# Patient Record
Sex: Male | Born: 1955 | Race: White | Hispanic: No | Marital: Married | Smoking: Never smoker
Health system: Southern US, Community
[De-identification: ages and names within clinical notes are randomized; demographics above are authoritative.]

## PROBLEM LIST (undated history)

## (undated) DIAGNOSIS — Z9841 Cataract extraction status, right eye: Secondary | ICD-10-CM

## (undated) DIAGNOSIS — E785 Hyperlipidemia, unspecified: Secondary | ICD-10-CM

## (undated) DIAGNOSIS — N200 Calculus of kidney: Secondary | ICD-10-CM

## (undated) DIAGNOSIS — Z7982 Long term (current) use of aspirin: Secondary | ICD-10-CM

## (undated) DIAGNOSIS — I10 Essential (primary) hypertension: Secondary | ICD-10-CM

## (undated) DIAGNOSIS — T8859XA Other complications of anesthesia, initial encounter: Secondary | ICD-10-CM

## (undated) DIAGNOSIS — N401 Enlarged prostate with lower urinary tract symptoms: Secondary | ICD-10-CM

## (undated) DIAGNOSIS — I700 Atherosclerosis of aorta: Secondary | ICD-10-CM

## (undated) DIAGNOSIS — N210 Calculus in bladder: Secondary | ICD-10-CM

## (undated) DIAGNOSIS — E119 Type 2 diabetes mellitus without complications: Secondary | ICD-10-CM

## (undated) DIAGNOSIS — I2510 Atherosclerotic heart disease of native coronary artery without angina pectoris: Secondary | ICD-10-CM

## (undated) DIAGNOSIS — M1990 Unspecified osteoarthritis, unspecified site: Secondary | ICD-10-CM

## (undated) DIAGNOSIS — K5790 Diverticulosis of intestine, part unspecified, without perforation or abscess without bleeding: Secondary | ICD-10-CM

## (undated) DIAGNOSIS — I4510 Unspecified right bundle-branch block: Secondary | ICD-10-CM

## (undated) DIAGNOSIS — I5189 Other ill-defined heart diseases: Secondary | ICD-10-CM

## (undated) HISTORY — PX: KIDNEY STONE SURGERY: SHX686

---

## 2002-08-17 DIAGNOSIS — N200 Calculus of kidney: Secondary | ICD-10-CM

## 2002-08-17 DIAGNOSIS — N12 Tubulo-interstitial nephritis, not specified as acute or chronic: Secondary | ICD-10-CM

## 2002-08-17 HISTORY — DX: Calculus of kidney: N20.0

## 2002-08-17 HISTORY — DX: Tubulo-interstitial nephritis, not specified as acute or chronic: N12

## 2003-06-26 ENCOUNTER — Other Ambulatory Visit: Payer: Self-pay

## 2006-01-22 ENCOUNTER — Ambulatory Visit: Payer: Self-pay | Admitting: Internal Medicine

## 2008-05-18 ENCOUNTER — Ambulatory Visit: Payer: Self-pay | Admitting: Gastroenterology

## 2010-05-26 ENCOUNTER — Emergency Department: Payer: Self-pay | Admitting: Emergency Medicine

## 2010-05-27 ENCOUNTER — Ambulatory Visit: Payer: Self-pay | Admitting: Specialist

## 2011-01-28 ENCOUNTER — Emergency Department: Payer: Self-pay | Admitting: Emergency Medicine
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
## 2012-06-25 IMAGING — CR LEFT MIDDLE FINGER 2+V
1 series · 3 of 3 positions shown · non-contrast
Comparison: none

REASON FOR EXAM: post reduction
COMMENTS:

[Series 1: view not recorded · 0.17mm/px · 3 of 3 slices shown]
[im 1/3]
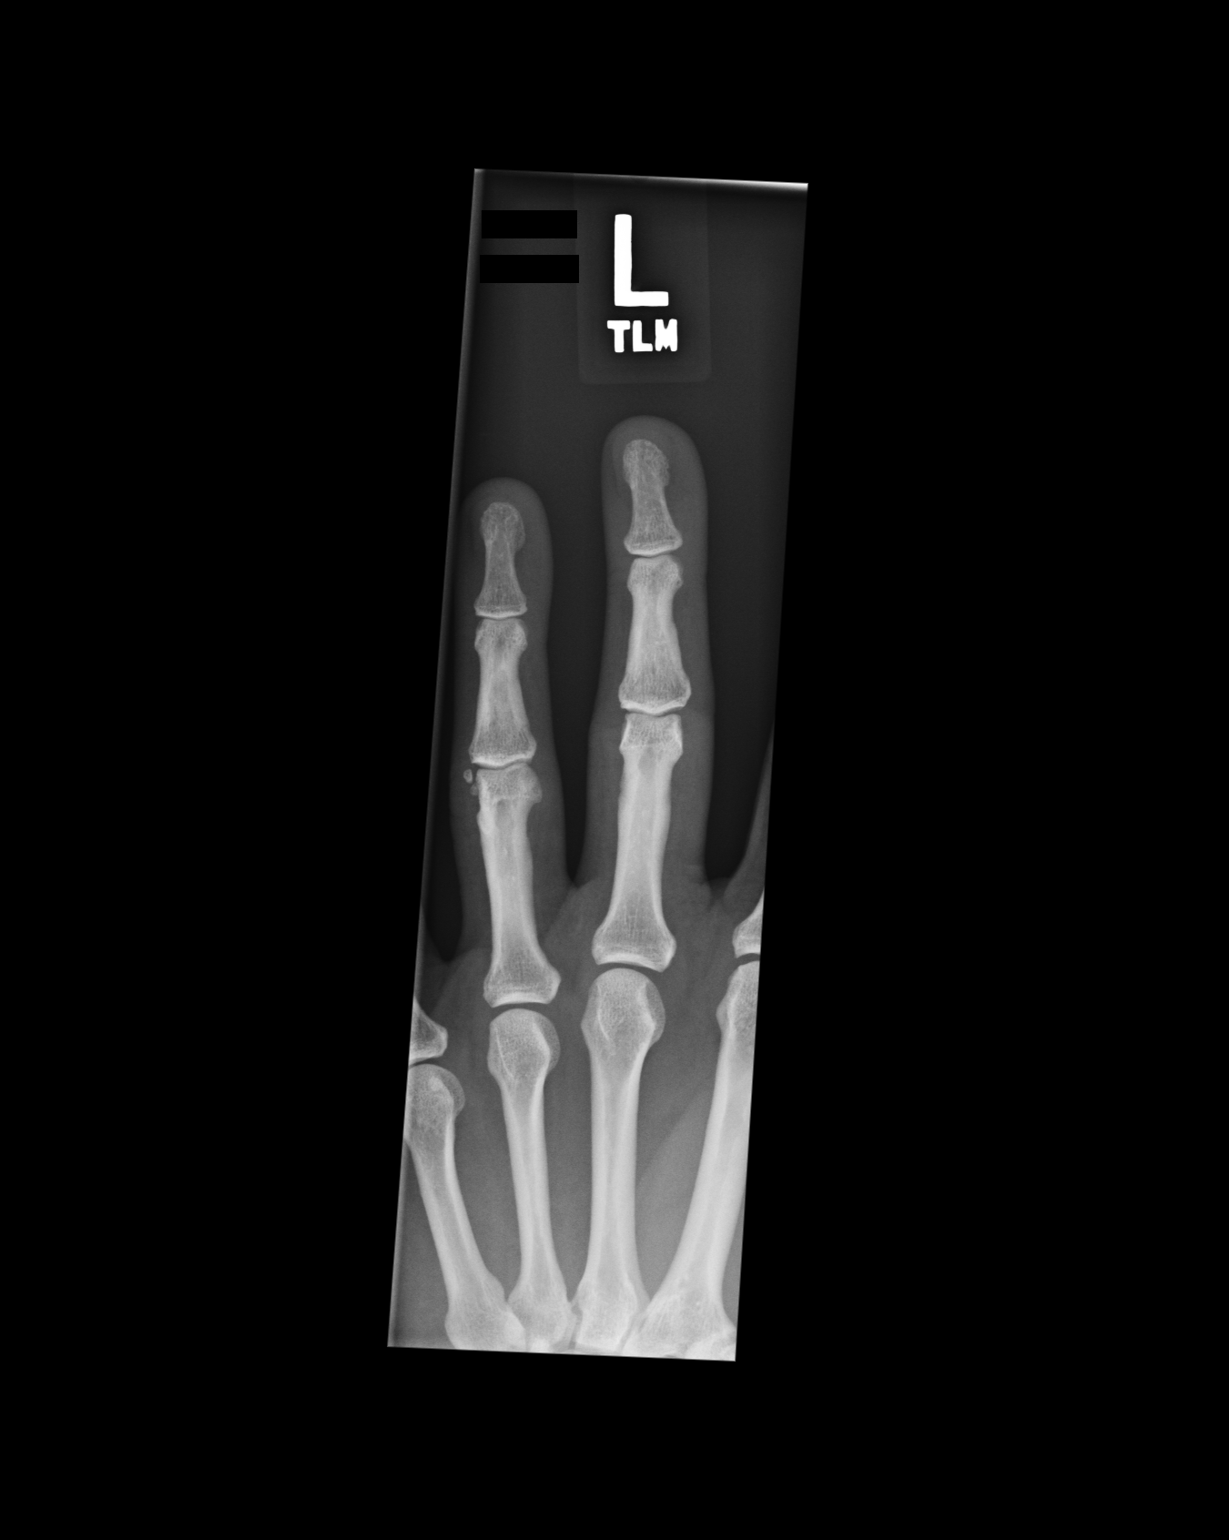
[im 2/3]
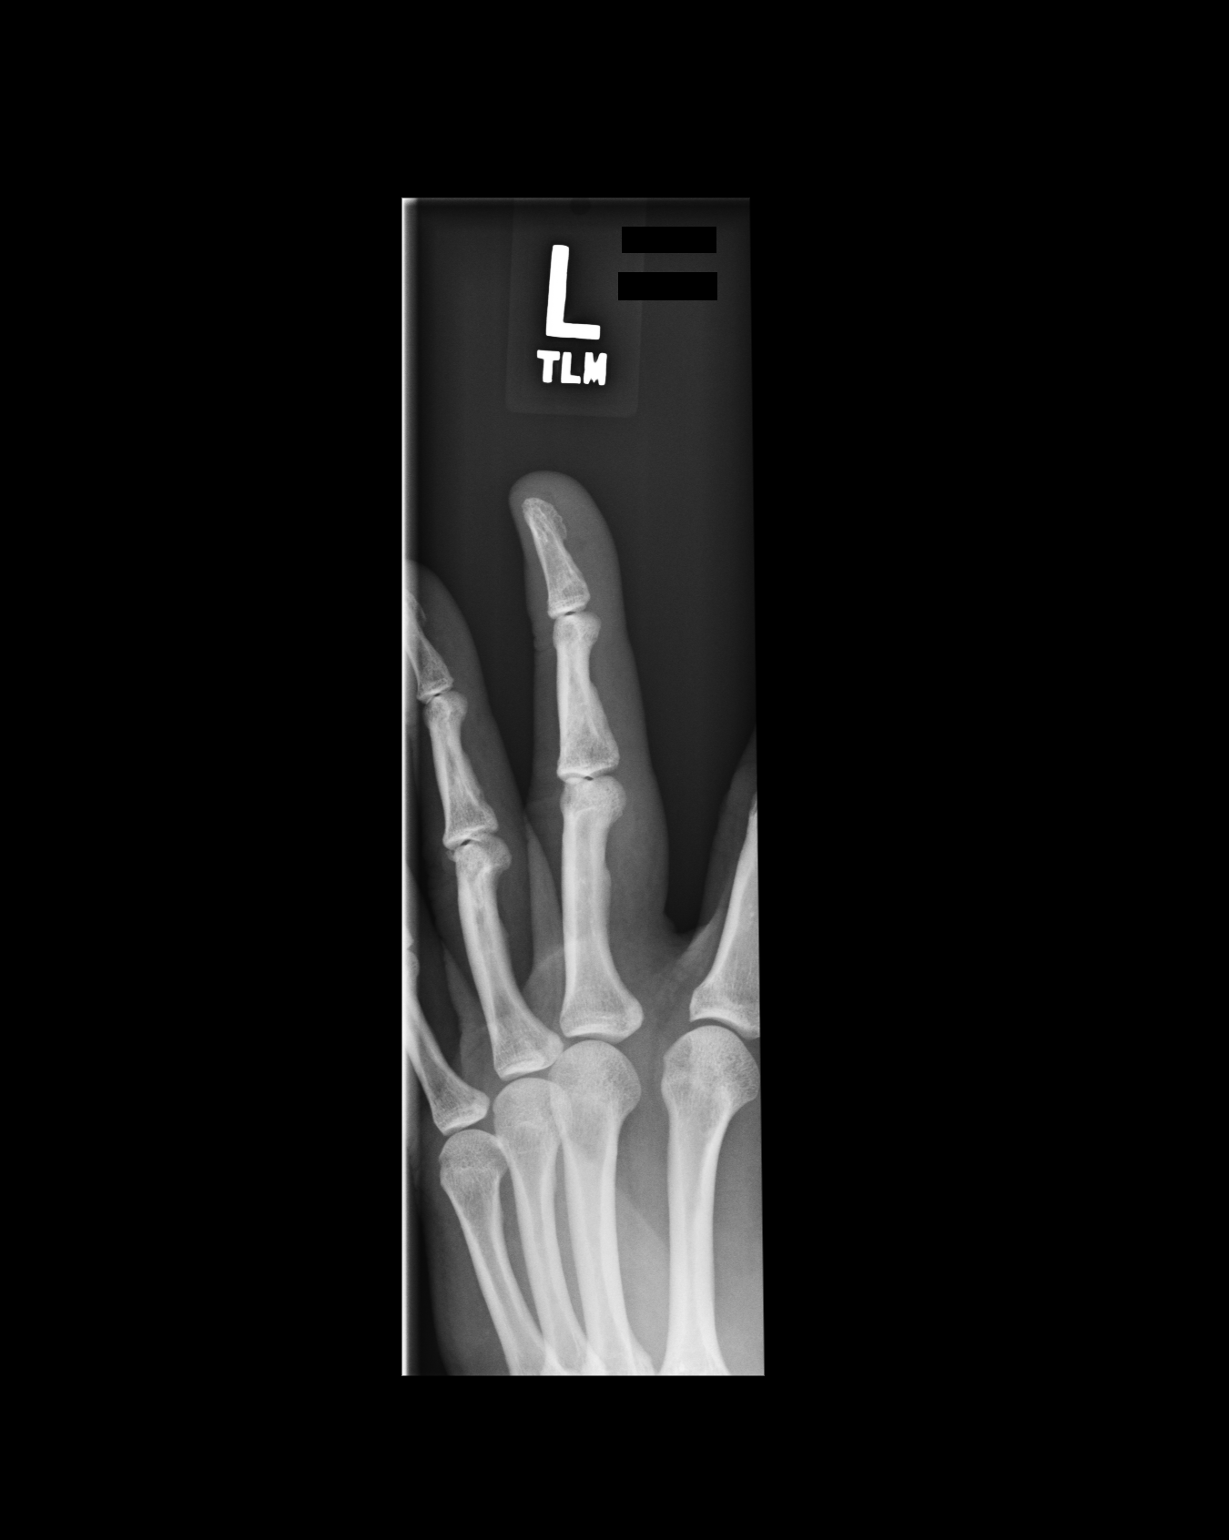
[im 3/3]
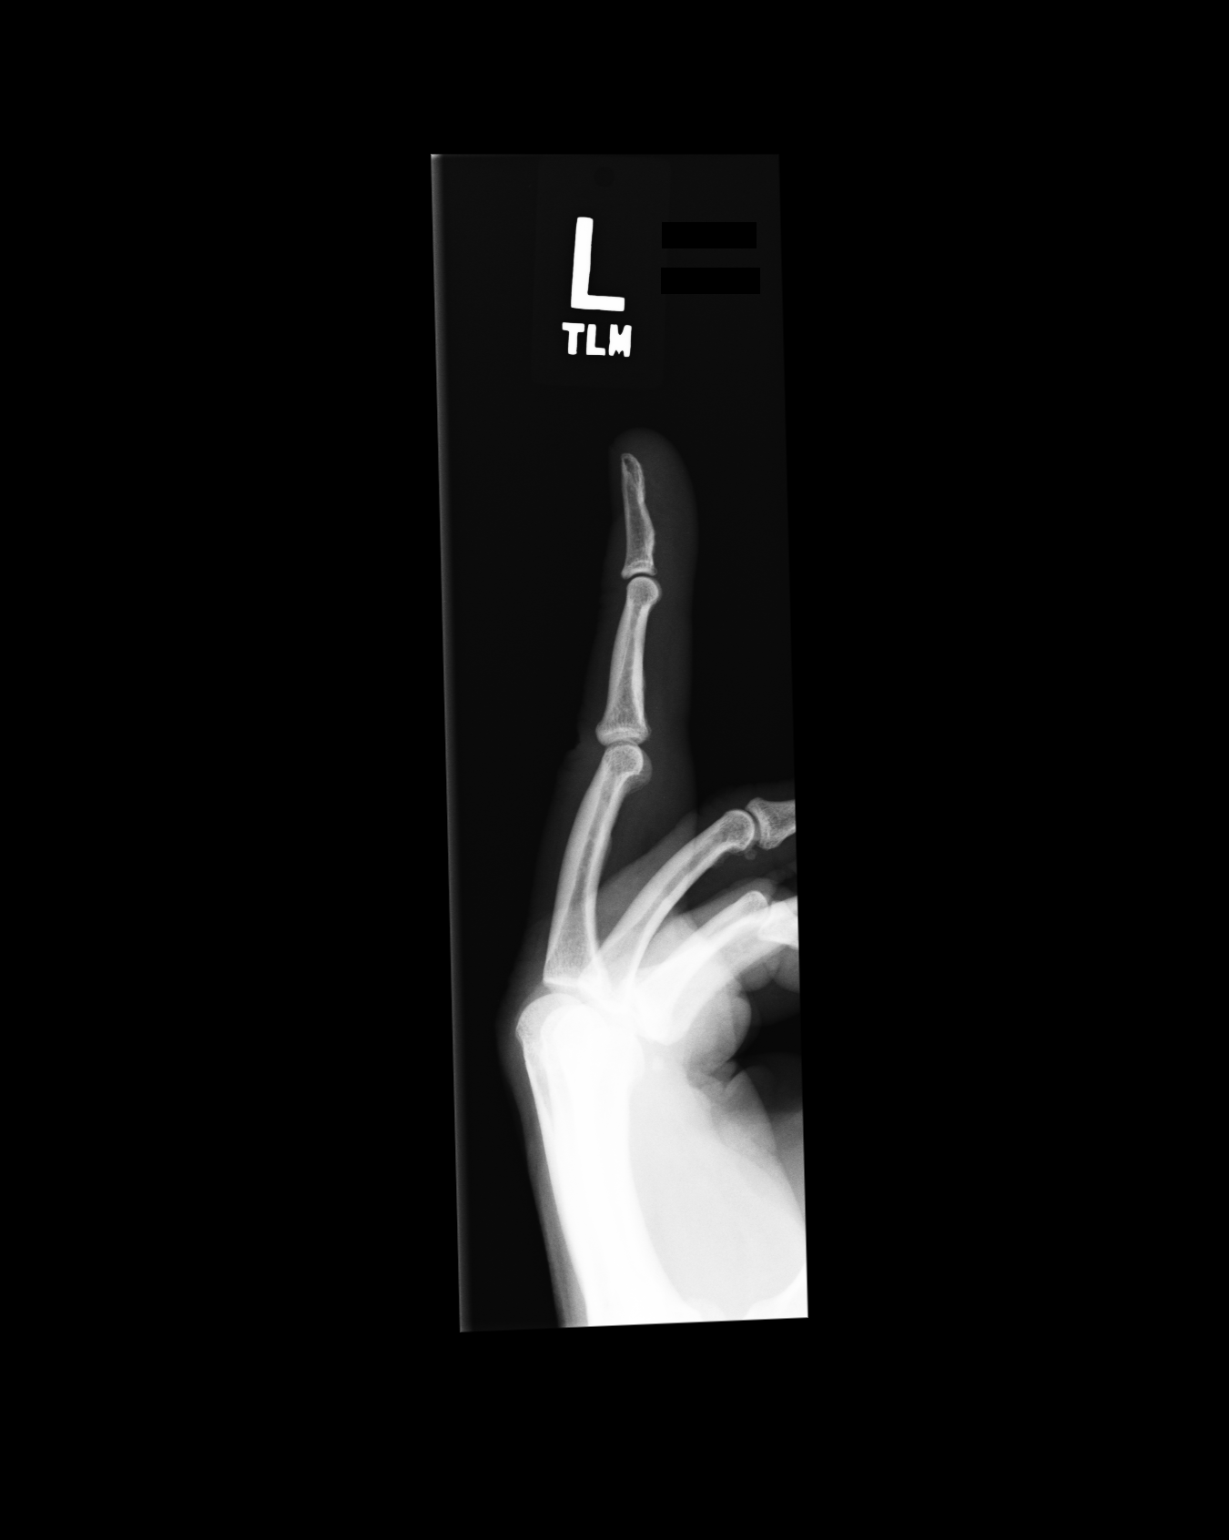

[3 of 3 positions shown; findings below may reference images not displayed]

PROCEDURE:     DXR - DXR FINGER MID 3RD DIGIT LT HAND  - January 28, 2011  [DATE]

RESULT:     Three views of the left third or long finger are submitted. The
patient undergone reduction of previously dislocated PIP joint. I do not see
evidence of a fracture. Positioning now is anatomic. A small amount of soft
tissue swelling is present over the PIP joint.
IMPRESSION: I do not see evidence of a fracture in this now previously
dislocated third finger which has been relocated.

## 2018-01-21 ENCOUNTER — Emergency Department
Admission: EM | Admit: 2018-01-21 | Discharge: 2018-01-21 | Disposition: A | Discharge status: Home / Self Care | Payer: 59 | Attending: Emergency Medicine | Admitting: Emergency Medicine

## 2018-01-21 ENCOUNTER — Other Ambulatory Visit: Payer: Self-pay

## 2018-01-21 DIAGNOSIS — I10 Essential (primary) hypertension: Secondary | ICD-10-CM | POA: Insufficient documentation

## 2018-01-21 DIAGNOSIS — R1031 Right lower quadrant pain: Secondary | ICD-10-CM | POA: Diagnosis present

## 2018-01-21 DIAGNOSIS — N200 Calculus of kidney: Secondary | ICD-10-CM | POA: Insufficient documentation

## 2018-01-21 DIAGNOSIS — E119 Type 2 diabetes mellitus without complications: Secondary | ICD-10-CM | POA: Diagnosis not present

## 2018-01-21 HISTORY — DX: Essential (primary) hypertension: I10

## 2018-01-21 HISTORY — DX: Type 2 diabetes mellitus without complications: E11.9

## 2018-01-21 HISTORY — DX: Calculus of kidney: N20.0

## 2018-01-21 LAB — COMPREHENSIVE METABOLIC PANEL
ALK PHOS: 43 U/L (ref 38–126)
ALT: 25 U/L (ref 17–63)
AST: 29 U/L (ref 15–41)
Albumin: 4.4 g/dL (ref 3.5–5.0)
Anion gap: 10 (ref 5–15)
BILIRUBIN TOTAL: 1.2 mg/dL (ref 0.3–1.2)
BUN: 22 mg/dL — AB (ref 6–20)
CO2: 25 mmol/L (ref 22–32)
CREATININE: 1.44 mg/dL — AB (ref 0.61–1.24)
Calcium: 9.6 mg/dL (ref 8.9–10.3)
Chloride: 104 mmol/L (ref 101–111)
GFR calc Af Amer: 59 mL/min — ABNORMAL LOW (ref >60)
GFR, EST NON AFRICAN AMERICAN: 51 mL/min — AB (ref >60)
Glucose, Bld: 145 mg/dL — ABNORMAL HIGH (ref 65–99)
Potassium: 4.4 mmol/L (ref 3.5–5.1)
Sodium: 139 mmol/L (ref 135–145)
TOTAL PROTEIN: 7.6 g/dL (ref 6.5–8.1)

## 2018-01-21 LAB — URINALYSIS, COMPLETE (UACMP) WITH MICROSCOPIC
Bacteria, UA: NONE SEEN
Bilirubin Urine: NEGATIVE
Glucose, UA: NEGATIVE mg/dL
Ketones, ur: 5 mg/dL — AB
LEUKOCYTES UA: NEGATIVE
Nitrite: NEGATIVE
PH: 5 (ref 5.0–8.0)
Protein, ur: NEGATIVE mg/dL
SPECIFIC GRAVITY, URINE: 1.017 (ref 1.005–1.030)
Squamous Epithelial / LPF: NONE SEEN (ref 0–5)

## 2018-01-21 LAB — CBC WITH DIFFERENTIAL/PLATELET
BASOS ABS: 0.1 10*3/uL (ref 0–0.1)
Basophils Relative: 1 %
EOS ABS: 0.1 10*3/uL (ref 0–0.7)
Eosinophils Relative: 1 %
HCT: 48.6 % (ref 40.0–52.0)
Hemoglobin: 16.3 g/dL (ref 13.0–18.0)
Lymphocytes Relative: 27 %
Lymphs Abs: 2.6 10*3/uL (ref 1.0–3.6)
MCH: 30.5 pg (ref 26.0–34.0)
MCHC: 33.5 g/dL (ref 32.0–36.0)
MCV: 91.1 fL (ref 80.0–100.0)
Monocytes Absolute: 0.8 10*3/uL (ref 0.2–1.0)
Monocytes Relative: 8 %
Neutro Abs: 6.1 10*3/uL (ref 1.4–6.5)
Neutrophils Relative %: 63 %
PLATELETS: 199 10*3/uL (ref 150–440)
RBC: 5.33 MIL/uL (ref 4.40–5.90)
RDW: 14.4 % (ref 11.5–14.5)
WBC: 9.6 10*3/uL (ref 3.8–10.6)

## 2018-01-21 NOTE — Discharge Instructions (Addendum)
Please seek medical attention for any high fevers, chest pain, shortness of breath, change in behavior, persistent vomiting, bloody stool or any other new or concerning symptoms.  

## 2018-01-21 NOTE — ED Triage Notes (Signed)
Pt c/o testicular pain on the right side that radiates into the right flank - pt has hx of kidney stones

## 2018-01-21 NOTE — ED Provider Notes (Signed)
[facility] Emergency Department Provider Note  ___________________________________   I have reviewed the triage vital signs and the nursing notes.   HISTORY  Chief Complaint Testicle Pain and Flank Pain   History limited by: Not Limited   HPI Charles Mccullough is a 62 y.o. male who presents to the emergency department today because of concerns for abdominal pain.  Patient states it started today.  Initially started in his right testicle.  He thought that by lying down it might help.  The pain however started rating up towards his right flank.  He states the pain that was in his testicle is 1 or 2 but the pain in his flank was closer to an 8 out of 10.  It was associated by nausea.  He also had some difficulty with urination. By the time my examination he states that the pain had resolved.  He does have a history of kidney stones in the somewhat reminded him of that.  He denies any fevers.   Per medical record review patient has a history of kidney stones  Past Medical History:  Diagnosis Date  . Diabetes mellitus without complication (HCC)   . Hypertension   . Kidney stones     There are no active problems to display for this patient.   Past Surgical History:  Procedure Laterality Date  . KIDNEY STONE SURGERY      Prior to Admission medications   Not on File    Allergies Patient has no known allergies.  No family history on file.  Social History Social History   Tobacco Use  . Smoking status: Never Smoker  . Smokeless tobacco: Never Used  Substance Use Topics  . Alcohol use: Never    Frequency: Never  . Drug use: Never    Review of Systems Constitutional: No fever/chills Eyes: No visual changes. ENT: No sore throat. Cardiovascular: Denies chest pain. Respiratory: Denies shortness of breath. Gastrointestinal: Positive for right flank pain.  Genitourinary: Negative for dysuria. Positive for testicular pain. Musculoskeletal: Negative  for back pain. Skin: Negative for rash. Neurological: Negative for headaches, focal weakness or numbness.  ____________________________________________   PHYSICAL EXAM:  VITAL SIGNS: ED Triage Vitals [01/21/18 1613]  Enc Vitals Group     BP (!) 160/82     Pulse Rate 86     Resp 15     Temp 98.1 F (36.7 C)     Temp Source Oral     SpO2 99 %     Weight 226 lb (102.5 kg)     Height 6\' 2"  (1.88 m)     Head Circumference      Peak Flow      Pain Score 2     Pain Loc      Pain Edu?      Excl. in GC?      Constitutional: Alert and oriented.  Eyes: Conjunctivae are normal.  ENT      Head: Normocephalic and atraumatic.      Nose: No congestion/rhinnorhea.      Mouth/Throat: Mucous membranes are moist.      Neck: No stridor. Hematological/Lymphatic/Immunilogical: No cervical lymphadenopathy. Cardiovascular: Normal rate, regular rhythm.  No murmurs, rubs, or gallops.  Respiratory: Normal respiratory effort without tachypnea nor retractions. Breath sounds are clear and equal bilaterally. No wheezes/rales/rhonchi. Gastrointestinal: Soft and non tender. No rebound. No guarding.  Genitourinary: Deferred Musculoskeletal: Normal range of motion in all extremities. No lower extremity edema. Neurologic:  Normal speech and  language. No gross focal neurologic deficits are appreciated.  Skin:  Skin is warm, dry and intact. No rash noted. Psychiatric: Mood and affect are normal. Speech and behavior are normal. Patient exhibits appropriate insight and judgment.  ____________________________________________    LABS (pertinent positives/negatives)  UA clear, moderate urine dipstick, RBC 11-20, negative leukocytes and nitrite CMP na 139, k 4.4, glu 145, cr 1.44 CBC wbc 9.6, hgb 16.3, plt 199 ____________________________________________   EKG  None  ____________________________________________     RADIOLOGY  None  ____________________________________________   PROCEDURES  Procedures  ____________________________________________   INITIAL IMPRESSION / ASSESSMENT AND PLAN / ED COURSE  Pertinent labs & imaging results that were available during my care of the patient were reviewed by me and considered in my medical decision making (see chart for details).   Patient presented to the emergency department today because of concerns for right testicular pain that then radiated up to his right groin.  Patient does have a history of kidney stones.  Urine had red blood cells in it.  The patient however did feel better at the time of my examination.  At this point I think likely kidney stone that is passed.  Did offer CT scan however patient declined and I think that is completely reasonable.  Discussed return precautions with the patient.   ____________________________________________   FINAL CLINICAL IMPRESSION(S) / ED DIAGNOSES  Final diagnoses:  Kidney stone     Note: This dictation was prepared with Dragon dictation. Any transcriptional errors that result from this process are unintentional     Phineas SemenGoodman, Dosha Broshears, MD 01/21/18 [phone]

## 2019-08-24 ENCOUNTER — Ambulatory Visit: Payer: 59 | Attending: Internal Medicine

## 2019-08-24 DIAGNOSIS — Z20822 Contact with and (suspected) exposure to covid-19: Secondary | ICD-10-CM

## 2019-08-25 ENCOUNTER — Other Ambulatory Visit: Payer: 59

## 2019-08-26 LAB — NOVEL CORONAVIRUS, NAA: SARS-CoV-2, NAA: NOT DETECTED

## 2019-09-07 ENCOUNTER — Ambulatory Visit: Payer: 59 | Attending: Internal Medicine

## 2019-09-07 DIAGNOSIS — Z20822 Contact with and (suspected) exposure to covid-19: Secondary | ICD-10-CM

## 2019-09-08 LAB — NOVEL CORONAVIRUS, NAA: SARS-CoV-2, NAA: NOT DETECTED

## 2022-12-30 ENCOUNTER — Other Ambulatory Visit: Payer: Self-pay | Admitting: Internal Medicine

## 2022-12-30 DIAGNOSIS — R1310 Dysphagia, unspecified: Secondary | ICD-10-CM

## 2023-01-05 ENCOUNTER — Ambulatory Visit
Admission: RE | Admit: 2023-01-05 | Discharge: 2023-01-05 | Disposition: A | Discharge status: Home / Self Care | Payer: Medicare HMO | Source: Ambulatory Visit | Attending: Internal Medicine | Admitting: Internal Medicine

## 2023-01-05 DIAGNOSIS — R1310 Dysphagia, unspecified: Secondary | ICD-10-CM | POA: Insufficient documentation

## 2023-07-29 ENCOUNTER — Encounter: Payer: Self-pay | Admitting: Urology

## 2023-07-29 ENCOUNTER — Ambulatory Visit: Payer: Medicare HMO | Admitting: Urology

## 2023-07-29 VITALS — BP 124/73 | HR 91 | Ht 73.0 in | Wt 211.5 lb

## 2023-07-29 DIAGNOSIS — R3129 Other microscopic hematuria: Secondary | ICD-10-CM

## 2023-07-29 DIAGNOSIS — N390 Urinary tract infection, site not specified: Secondary | ICD-10-CM

## 2023-07-29 DIAGNOSIS — R82998 Other abnormal findings in urine: Secondary | ICD-10-CM

## 2023-07-29 DIAGNOSIS — R972 Elevated prostate specific antigen [PSA]: Secondary | ICD-10-CM

## 2023-07-29 LAB — MICROSCOPIC EXAMINATION: RBC, Urine: >30 /[HPF] — AB (ref 0–2)

## 2023-07-29 LAB — URINALYSIS, COMPLETE
Bilirubin, UA: NEGATIVE
Glucose, UA: NEGATIVE
Ketones, UA: NEGATIVE
Nitrite, UA: NEGATIVE
Specific Gravity, UA: >1.03 — ABNORMAL HIGH (ref 1.005–1.030)
Urobilinogen, Ur: 1 mg/dL (ref 0.2–1.0)
pH, UA: 5.5 (ref 5.0–7.5)

## 2023-07-29 LAB — BLADDER SCAN AMB NON-IMAGING: Scan Result: 38

## 2023-07-29 NOTE — Patient Instructions (Signed)

## 2023-07-29 NOTE — Progress Notes (Signed)
Marcelle Overlie Plume,acting as a scribe for [name], MD.,have documented all relevant documentation on the behalf of [name], MD,as directed by  [name], MD while in the presence of [name], MD.  07/29/23 9:57 AM   Charles Mccullough 68-Jul-1957 [id number]  Referring provider: Marguarite Arbour, MD [address] [facility] [place],  [place] [address]  Chief Complaint  Patient presents with   Establish Care   Recurrent UTI    HPI: 67 year-old male who is referred for further evaluation of recurrent UTI's.   He had COVID in July and was really sick at home. Subsequently, he started to have urinary issues, including cloudy urine, dysuria, urgency, frequency. He saw his primary care in August and had a frankly positive urinalysis but the culture was negative. He was treated with a round of antibiotics.Since then, He has had 3 additional frankly positive urinalysis but negative urine cultures. He is not having any pain. Every time he is treated with antibiotics his symptoms resolve but not fully. He completed his last round of antibiotics a few weeks ago. His last course was 15 days. He has also started Flomax.   His PVR was 38 today.   He doesn't have any baseline urinary symptoms. His PSA was 3.36 in May and more recently rose to 5.12 in November, but he was also symptomatic at the time.   He doesn't have any recent upper tract imaging. He does have a very remote history of a septic stone requiring emergent stent placement many years ago. He doesn't think that his symptoms are consistent with his previous stone episode. He's not having any flank pain.  His urinalysis today has >30 RBC, 6-10 WBC, and no bacteria.  He is not symptomatic today.   He denies a smoking history or exposure to chemicals.   Results for orders placed or performed in visit on 07/29/23  Bladder Scan (Post Void Residual) in office  Result Value Ref Range   Scan Result 38 ml      PMH: Past Medical History:  Diagnosis Date   Diabetes mellitus without complication (HCC)    Hypertension    Kidney stones     Surgical History: Past Surgical History:  Procedure Laterality Date   KIDNEY STONE SURGERY      Home Medications:  Allergies as of 07/29/2023   No Known Allergies      Medication List        Accurate as of July 29, 2023  9:57 AM. If you have any questions, ask your nurse or doctor.          aspirin EC 81 MG tablet Take 81 mg by mouth daily.   atorvastatin 10 MG tablet Commonly known as: LIPITOR Take 1 tablet by mouth daily.   glimepiride 4 MG tablet Commonly known as: AMARYL Take 4 mg by mouth daily with breakfast.   losartan 100 MG tablet Commonly known as: COZAAR Take 100 mg by mouth daily.   metFORMIN 1000 MG tablet Commonly known as: GLUCOPHAGE Take 1,000 mg by mouth daily with breakfast.   pantoprazole 40 MG tablet Commonly known as: PROTONIX Take 40 mg by mouth 2 (two) times daily.   tamsulosin 0.4 MG Caps capsule Commonly known as: FLOMAX Take 0.4 mg by mouth daily.         Social History:  reports that he has never smoked. He has never used smokeless tobacco. He reports that he does not drink alcohol and  does not use drugs.   Physical Exam: BP 124/73   Pulse 91   Ht 6\' 1"  (1.854 m)   Wt 211 lb 8 oz (95.9 kg)   BMI 27.90 kg/m   Constitutional:  Alert and oriented, No acute distress. HEENT: [place] AT, moist mucus membranes.  Trachea midline, no masses. Rectal: 40-50 gram prostate, non-tender, no nodularity Neurologic: Grossly intact, no focal deficits, moving all 4 extremities. Psychiatric: Normal mood and affect.   Assessment & Plan:    1. Microscopic hematuria/"Recurrent UTI"  -Suspicion for possible underlying pathology given positive urinalysis, frequency of occurrences, and negative urine cultures. -Differential diagnosis certainly includes kidney or bladder stone, less likely prostate abscess  or fistula or some sort of underlying malignancy, especially in the absence of positive urine cultures.  - Recommended a CT urogram and a cystoscopy as part of the workup.  -Urinalysis is still suspicious today in the absence of symptoms.  Will hold off on any additional treatment unless he becomes symptomatic.  Will defer sending another culture as all of his other cultures have been negative.  2. Elevated PSA - Rectal exam was benign and his PSA was drawn at the time of frankly positive slash inflamed appearing urine.  - We just recommend repeat in a few months once his urinalysis normalizes.  - Minimal concern for prostate cancer  Return for CT and cystoscopy.  I have reviewed the above documentation for accuracy and completeness, and I agree with the above.   [name], MD   [facility] [address] [place], [place] [address] [phone]

## 2023-08-16 ENCOUNTER — Ambulatory Visit
Admission: RE | Admit: 2023-08-16 | Discharge: 2023-08-16 | Disposition: A | Discharge status: Home / Self Care | Payer: Medicare HMO | Source: Ambulatory Visit | Attending: Urology | Admitting: Urology

## 2023-08-16 DIAGNOSIS — R3129 Other microscopic hematuria: Secondary | ICD-10-CM | POA: Diagnosis present

## 2023-08-16 DIAGNOSIS — N390 Urinary tract infection, site not specified: Secondary | ICD-10-CM | POA: Insufficient documentation

## 2023-08-16 MED ORDER — IOHEXOL 300 MG/ML  SOLN
100.0000 mL | Freq: Once | INTRAMUSCULAR | Status: AC | PRN
  Administered 2023-08-16: 100 mL via INTRAVENOUS

## 2023-09-14 ENCOUNTER — Ambulatory Visit (INDEPENDENT_AMBULATORY_CARE_PROVIDER_SITE_OTHER): Payer: Medicare HMO | Admitting: Urology

## 2023-09-14 VITALS — BP 105/64 | HR 80 | Ht 73.0 in | Wt 212.0 lb

## 2023-09-14 DIAGNOSIS — R3129 Other microscopic hematuria: Secondary | ICD-10-CM

## 2023-09-14 DIAGNOSIS — Z87440 Personal history of urinary (tract) infections: Secondary | ICD-10-CM | POA: Diagnosis not present

## 2023-09-14 DIAGNOSIS — N390 Urinary tract infection, site not specified: Secondary | ICD-10-CM

## 2023-09-14 DIAGNOSIS — N200 Calculus of kidney: Secondary | ICD-10-CM | POA: Diagnosis not present

## 2023-09-14 DIAGNOSIS — N210 Calculus in bladder: Secondary | ICD-10-CM | POA: Diagnosis not present

## 2023-09-14 LAB — URINALYSIS, COMPLETE
Bilirubin, UA: NEGATIVE
Glucose, UA: NEGATIVE
Ketones, UA: NEGATIVE
Leukocytes,UA: NEGATIVE
Nitrite, UA: NEGATIVE
Protein,UA: NEGATIVE
Specific Gravity, UA: 1.025 (ref 1.005–1.030)
Urobilinogen, Ur: 0.2 mg/dL (ref 0.2–1.0)
pH, UA: 5.5 (ref 5.0–7.5)

## 2023-09-14 LAB — MICROSCOPIC EXAMINATION

## 2023-09-14 NOTE — Patient Instructions (Signed)
Holmium Laser Enucleation of the Prostate (HoLEP)  HoLEP is a treatment for men with benign prostatic hyperplasia (BPH). The laser surgery removed blockages of urine flow, and is done without any incisions on the body.     What is HoLEP?  HoLEP is a type of laser surgery used to treat obstruction (blockage) of urine flow as a result of benign prostatic hyperplasia (BPH). In men with BPH, the prostate gland is not cancerous, but has become enlarged. An enlarged prostate can result in a number of urinary tract symptoms such as weak urinary stream, difficulty in starting urination, inability to urinate, frequent urination, or getting up at night to urinate.  HoLEP was developed in the 1990's as a more effective and less expensive surgical option for BPH, compared to other surgical options such as laser vaporization(PVP/greenlight laser), transurethral resection of the prostate(TURP), and open simple prostatectomy.   What happens during a HoLEP?  HoLEP requires general anesthesia ("asleep" throughout the procedure).   An antibiotic is given to reduce the risk of infection  A surgical instrument called a resectoscope is inserted through the urethra (the tube that carries urine from the bladder). The resectoscope has a camera that allows the surgeon to view the internal structure of the prostate gland, and to see where the incisions are being made during surgery.  The laser is inserted into the resectoscope and is used to enucleate (free up) the enlarged prostate tissue from the capsule (outer shell) and then to seal up any blood vessels. The tissue that has been removed is pushed back into the bladder.  A morcellator is placed through the resectoscope, and is used to suction out the prostate tissue that has been pushed into the bladder.  When the prostate tissue has been removed, the resectoscope is removed, and a foley catheter is placed to allow healing and drain the urine from the  bladder.     What happens after a HoLEP?  More than 95% of patients go home the same day a few hours after surgery. Less than 5% will be admitted to the hospital overnight for observation to monitor the urine, or if they have other medical problems.  Fluid is flushed through the catheter for about 1 hour after surgery to clear any blood from the urine. It is normal to have some blood in the urine after surgery. The need for blood transfusion is extremely rare.  Eating and drinking are permitted after the procedure once the patient has fully awakened from anesthesia.  The catheter is usually removed 2-3 days after surgery- the patient will come to clinic to have the catheter removed and make sure they can urinate on their own.  It is very important to drink lots of fluids after surgery for one week to keep the bladder flushed.  At first, there may be some burning with urination, but this typically improved within a few hours to days. Most patients do not have a significant amount of pain, and narcotic pain medications are rarely needed.  Symptoms of urinary frequency, urgency, and even leakage are NORMAL for the first few weeks after surgery as the bladder adjusts after having to work hard against blockage from the prostate for many years. This will improve, but can sometimes take several months.  The use of pelvic floor exercises (Kegel exercises) can help improve problems with urinary incontinence.   After catheter removal, patients will be seen at 12 weeks and 6 months for symptom check  No heavy lifting for  at least 2-3 weeks after surgery, however patients can walk and do light activities the first day after surgery. Return to work time depends on occupation.    What are the advantages of HoLEP?  HoLEP has been studied in many different parts of the world and has been shown to be a safe and effective procedure. Although there are many types of BPH surgeries available, HoLEP offers a  unique advantage in being able to remove a large amount of tissue without any incisions on the body, even in very large prostates, while decreasing the risk of bleeding and providing tissue for pathology (to look for cancer). This decreases the need for blood transfusions during surgery, minimizes hospital stay, and reduces the risk of needing repeat treatment.  What are the side effects of HoLEP?  Temporary burning and bleeding during urination. Some blood may be seen in the urine for weeks after surgery and is part of the healing process.  Urinary incontinence (inability to control urine flow) is expected in all patients immediately after surgery and they should wear pads for the first few days/weeks. This typically improves over the course of several weeks. Performing Kegel exercises can help decrease leakage from stress maneuvers such as coughing, sneezing, or lifting. The rate of long term leakage is very low, 1-2%. Patients may also have leakage with urgency and this may be treated with medication. The risk of urge incontinence can be dependent on several factors including age, prostate size, symptoms, and other medical problems.  Retrograde ejaculation or "backwards ejaculation." In 80% of cases, the patient will not see any fluid during ejaculation after surgery.  Erectile function is generally not significantly affected.   What are the risks of HoLEP?  Injury to the urethra or development of scar tissue at a later date  Injury to the capsule of the prostate (typically treated with longer catheterization).  Injury to the bladder or ureteral orifices (where the urine from the kidney drains out)  Infection of the bladder, testes, or kidneys (~4%)  Return of urinary obstruction at a later date requiring another operation (<2%)  Need for blood transfusion or re-operation due to bleeding  Failure to relieve all symptoms and/or need for prolonged catheterization after surgery  5-15% of  patients are found to have previously undiagnosed prostate cancer in their specimen. Prostate cancer can be treated after HoLEP.  Standard risks of anesthesia including blood clots, heart attacks, etc ~1-2% risk of long term urinary incontinence (leakage)  When should I call my doctor?  Fever over 101.3 degrees  Inability to urinate, or large blood clots in the urine  Transurethral Resection of the Prostate Transurethral resection of the prostate (TURP) is the removal, or resection, of part of the prostate tissue. This procedure is done to treat an enlarged prostate gland (benign prostatic hyperplasia). The goal of TURP is to remove enough prostate tissue to allow for a normal flow of urine. The procedure will allow you to empty your bladder more completely when you urinate so that you can urinate less often. In a transurethral resection, a thin telescope with a light, a camera, and an electric cutting edge (resectoscope) is passed through the urethra and into the prostate. The opening of the urethra is at the end of the penis. Tell a health care provider about: Any allergies you have. All medicines you are taking, including vitamins, herbs, eye drops, creams, and over-the-counter medicines. Any problems you or family members have had with anesthetic medicines. Any bleeding problems  you have. Any surgeries you have had. Any medical conditions you have. Any prostate infections you have had. What are the risks? Generally, this is a safe procedure. However, problems may occur, including: Infection. Bleeding. Allergic reactions to medicines. Blood in the urine (hematuria). Damage to nearby structures or organs. Other problems may occur, but they are rare. They include: Dry ejaculation, or having no semen come out during orgasm. Erectile dysfunction, or being unable to have or keep an erection. Scarring that leads to narrowing of the urethra. This narrowing may block the flow of  urine. Inability to control when you urinate (incontinence). Deep vein thrombosis. This is a blood clot that can develop in your leg. TURP syndrome. This can happen when you lose too much sodium during or after the procedure. Some signs and symptoms of this condition include: Weakness. Headaches. Nausea or vomiting. Muscle cramping. What happens before the procedure? When to stop eating and drinking Follow instructions from your health care provider about what you may eat and drink before your procedure. These may include: 8 hours before your procedure Stop eating most foods. Do not eat meat, fried foods, or fatty foods. Eat only light foods, such as toast or crackers. All liquids are okay except energy drinks and alcohol. 6 hours before your procedure Stop eating. Drink only clear liquids, such as water, clear fruit juice, black coffee, plain tea, and sports drinks. Do not drink energy drinks or alcohol. 2 hours before your procedure Stop drinking all liquids. You may be allowed to take medicines with small sips of water. If you do not follow your health care provider's instructions, your procedure may be delayed or canceled. Medicines Ask your health care provider about: Changing or stopping your regular medicines. This is especially important if you are taking diabetes medicines or blood thinners. Taking medicines such as aspirin and ibuprofen. These medicines can thin your blood. Do not take these medicines unless your health care provider tells you to take them. Taking over-the-counter medicines, vitamins, herbs, and supplements. Surgery safety Ask your health care provider what steps will be taken to help prevent infection. These steps may include: Removing hair at the surgery site. Washing skin with a germ-killing soap. Taking antibiotic medicine. General instructions Do not use any products that contain nicotine or tobacco for at least 4 weeks before the procedure. These  products include cigarettes, chewing tobacco, and vaping devices, such as e-cigarettes. If you need help quitting, ask your health care provider. If you will be going home right after the procedure, plan to have a responsible adult: Take you home from the hospital or clinic. You will not be allowed to drive. Care for you for the time you are told. What happens during the procedure?  An IV will be inserted into one of your veins. You will be given one or more of the following: A medicine to help you relax (sedative). A medicine to make you fall asleep (general anesthetic). A medicine that is injected into your spine to numb the area below and slightly above the injection site (spinal anesthetic). Your legs will be placed in foot rests (stirrups) so that your legs are apart and your knees are bent. The resectoscope will be passed through your urethra to your prostate. Parts of your prostate will be resected using the cutting edge of the resectoscope. Fluid will be passed to rinse out the cut tissues (irrigation). The resectoscope will be removed. A small, thin tube (catheter) will be passed through your urethra  and into your bladder. The catheter will drain urine into a bag outside of your body. The procedure may vary among health care providers and hospitals. What happens after the procedure? Your blood pressure, heart rate, breathing rate, and blood oxygen level will be monitored until you leave the hospital or clinic. You will be given fluids through the IV. The IV will be removed when you start eating and drinking normally. You may have some pain. Pain medicine will be available to help you. You will have a catheter draining your urine. You may have blood in your urine. Your catheter may be kept in until your urine is clear. Your urinary drainage will be monitored. If necessary, your bladder may be rinsed out (irrigated) through your catheter. You will be encouraged to walk around as soon  as possible. You may have to wear compression stockings. These stockings help to prevent blood clots and reduce swelling in your legs. If you were given a sedative during the procedure, it can affect you for several hours. Do not drive or operate machinery until your health care provider says that it is safe. Summary Transurethral resection of the prostate (TURP) is the removal (resection) of part of the prostate tissue. The goal of this procedure is to remove enough prostate tissue to allow for a normal flow of urine. Follow instructions from your health care provider about taking medicines and about eating and drinking before the procedure. This information is not intended to replace advice given to you by your health care provider. Make sure you discuss any questions you have with your health care provider. Document Revised: 04/29/2021 Document Reviewed: 04/29/2021 Elsevier Patient Education  2024 [employer]Prostatic Urethral Lift  Prostatic urethral lift is a surgical procedure to treat symptoms of prostate gland enlargement that occurs with age (benign prostatic hypertrophy, BPH). The urethra passes between the two lobes of the prostate. The urethra is the part of the body that drains urine from the bladder. As the prostate enlarges, it can push on the urethra and cause problems with urinating. This procedure involves placing an implant that holds the prostate away from the urethra. The procedure is done using a thin device called a cystoscope. The device is inserted through the tip of the penis and moved up the urethra to the prostate. This is less invasive than other procedures that require an incision. You may have this procedure if: You have symptoms of BPH. Your prostate is not severely enlarged. Medicines to treat BPH are not working or not tolerated. You want to avoid possible sexual side effects from medicines or other procedures that are used to treat BPH. Tell a health care  provider about: Any allergies you have. All medicines you are taking, including vitamins, herbs, eye drops, creams, and over-the-counter medicines. Any problems you or family members have had with anesthetic medicines. Any bleeding problems you have. Any surgeries you have had. Any medical conditions you have. What are the risks? Generally, this is a safe procedure. However, problems may occur, including: Bleeding. Infection. Leaking of urine (incontinence). Allergic reactions to medicines. Return of BPH symptoms after 2 years, requiring more treatment. What happens before the procedure? When to stop eating and drinking Follow instructions from your health care provider about what you may eat and drink before your procedure. These may include: 8 hours before your procedure Stop eating most foods. Do not eat meat, fried foods, or fatty foods. Eat only light foods, such as toast or crackers. All liquids are  okay except energy drinks and alcohol. 6 hours before your procedure Stop eating. Drink only clear liquids, such as water, clear fruit juice, black coffee, plain tea, and sports drinks. Do not drink energy drinks or alcohol. 2 hours before your procedure Stop drinking all liquids. You may be allowed to take medicines with small sips of water. If you do not follow your health care provider's instructions, your procedure may be delayed or canceled. Medicines Ask your health care provider about: Changing or stopping your regular medicines. This is especially important if you are taking diabetes medicines or blood thinners. Taking medicines such as aspirin and ibuprofen. These medicines can thin your blood. Do not take these medicines unless your health care provider tells you to take them. Taking over-the-counter medicines, vitamins, herbs, and supplements. Surgery safety Ask your health care provider what steps will be taken to help prevent infection. These steps may  include: Removing hair at the surgery site. Washing skin with a germ-killing soap. Taking antibiotic medicine. General instructions Do not use any products that contain nicotine or tobacco for at least 4 weeks before the procedure. These products include cigarettes, chewing tobacco, and vaping devices, such as e-cigarettes. If you need help quitting, ask your health care provider. If you will be going home right after the procedure, plan to have a responsible adult: Take you home from the hospital or clinic. You will not be allowed to drive. Care for you for the time you are told. What happens during the procedure? An IV may be inserted into one of your veins. You will be given one or more of the following: A medicine to help you relax (sedative). A medicine that is injected into your urethra to numb the area (local anesthetic). A medicine to make you fall asleep (general anesthetic). A cystoscope will be inserted into your penis and moved through your urethra to your prostate. A device will be inserted through the cystoscope and used to press the lobes of your prostate away from your urethra. Implants will be inserted through the device to hold the lobes of your prostate in the widened position. The device and cystoscope will be removed. The procedure may vary among health care providers and hospitals. What happens after the procedure? Your blood pressure, heart rate, breathing rate, and blood oxygen level be monitored until you leave the hospital or clinic. If you were given a sedative during the procedure, it can affect you for several hours. Do not drive or operate machinery until your health care provider says that it is safe. Summary Prostatic urethral lift is a surgical procedure to relieve symptoms of prostate gland enlargement that occurs with age (benign prostatic hypertrophy, BPH). The procedure is performed with a thin device called a cystoscope. This device is inserted through  the tip of the penis and moved up the urethra to reach the prostate. This is less invasive than other procedures that require an incision. If you will be going home right after the procedure, plan to have a responsible adult take you home from the hospital or clinic. You will not be allowed to drive. This information is not intended to replace advice given to you by your health care provider. Make sure you discuss any questions you have with your health care provider. Document Revised: 02/28/2021 Document Reviewed: 02/28/2021 Elsevier Patient Education  2024 [employer].

## 2023-09-14 NOTE — Progress Notes (Signed)
   09/14/23  CC:  Chief Complaint  Patient presents with   Cysto    HPI: 68 year old male with a personal history of microscopic hematuria and infection you presents today for cystoscopy.  He underwent CT urogram that showed a large 2.2 cm jack stone in his bladder along with BPH.  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  Imaging was otherwise unremarkable.  Blood pressure 105/64, pulse 80, height 6\' 1"  (1.854 m), weight 212 lb (96.2 kg). NED. A&Ox3.   No respiratory distress   Abd soft, NT, ND Normal phallus with bilateral descended testicles  Cystoscopy Procedure Note  Patient identification was confirmed, informed consent was obtained, and patient was prepped using Betadine solution.  Lidocaine jelly was administered per urethral meatus.     Pre-Procedure: - Inspection reveals a normal caliber ureteral meatus.  Procedure: The flexible cystoscope was introduced without difficulty - No urethral strictures/lesions are present. - Enlarged prostate bilobar coaptation - Elevated bladder neck - Bilateral ureteral orifices identified - Bladder mucosa  reveals no ulcers, tumors, or lesions -Large greater than 2 cm jack stone - No trabeculation  Retroflexion shows stone distorting visualization.   Post-Procedure: - Patient tolerated the procedure well  Assessment/ Plan:  1. Recurrent UTI (Primary) Likely secondary to #3 - Urinalysis, Complete  2. Microscopic hematuria Likely secondary to #3, no underlying bladder pathology identified other than stone - Urinalysis, Complete  3. Bladder stone Large jack bladder stone which is likely causing symptoms and microscopic blood in the urine along with possibly an ductions.  We discussed the etiology of bladder stones most commonly caused by outlet obstruction.  He does have fairly significant prostamegaly.  He would benefit from an outlet procedure at the time of treatment of his  stone.  We discussed options including HoLEP, UroLift, and TURP.  He was given information about all 3 and scheduled follow-up to discuss this in further details.  Alternatively, could just treat the stone via cystolitholapaxy but would have high recurrence for additional stones in the future.  4. Kidney stones Large but nonobstructing.  Will address this in the future.   F/u discuss surgery  [name], MD

## 2023-10-14 ENCOUNTER — Ambulatory Visit: Payer: Self-pay | Admitting: Urology

## 2023-10-14 VITALS — BP 146/79 | HR 74 | Ht 73.0 in | Wt 219.6 lb

## 2023-10-14 DIAGNOSIS — N210 Calculus in bladder: Secondary | ICD-10-CM

## 2023-10-14 DIAGNOSIS — Z01818 Encounter for other preprocedural examination: Secondary | ICD-10-CM | POA: Diagnosis not present

## 2023-10-14 LAB — URINALYSIS, COMPLETE
Bilirubin, UA: NEGATIVE
Ketones, UA: NEGATIVE
Leukocytes,UA: NEGATIVE
Nitrite, UA: NEGATIVE
Protein,UA: NEGATIVE
Specific Gravity, UA: >1.03 — ABNORMAL HIGH (ref 1.005–1.030)
Urobilinogen, Ur: 1 mg/dL (ref 0.2–1.0)
pH, UA: 5.5 (ref 5.0–7.5)

## 2023-10-14 LAB — MICROSCOPIC EXAMINATION

## 2023-10-14 NOTE — Progress Notes (Signed)
 Marcelle Overlie Plume,acting as a scribe for Charles Scotland, MD.,have documented all relevant documentation on the behalf of Charles Scotland, MD,as directed by  Charles Scotland, MD while in the presence of Charles Scotland, MD.  10/14/23 12:35 PM   Charles Mccullough 1956-01-08 [id number]  Referring provider: Marguarite Arbour, MD [address] [place] [facility] [place],  [place] [address]  Chief Complaint  Patient presents with   Pre-op Exam    HPI: 68 year-old male referred for evaluation and management of a large bladder stone, specifically a jackstone, kidney stone and BPH. He presents with a significant bladder stone, which is causing urinary symptoms due to incomplete bladder emptying.   He also has a large kidney stone in the left lower pole, measuring 2.2 cm, which is not currently causing obstruction but will need to be addressed in the future.    He is generally healthy, exercises regularly, and is not overweight, which is favorable for surgical outcomes.   He is accompanied today by his wife.   PMH: Past Medical History:  Diagnosis Date   Diabetes mellitus without complication (HCC)    Hypertension    Kidney stones     Surgical History: Past Surgical History:  Procedure Laterality Date   KIDNEY STONE SURGERY      Home Medications:  Allergies as of 10/14/2023       Reactions   Proton Pump Inhibitors    Made him feel bad        Medication List        Accurate as of October 14, 2023 12:35 PM. If you have any questions, ask your nurse or doctor.          aspirin EC 81 MG tablet Take 81 mg by mouth daily.   atorvastatin 10 MG tablet Commonly known as: LIPITOR Take 1 tablet by mouth daily.   glimepiride 4 MG tablet Commonly known as: AMARYL Take 4 mg by mouth daily with breakfast.   losartan 100 MG tablet Commonly known as: COZAAR Take 100 mg by mouth daily.   metFORMIN 1000 MG tablet Commonly known as: GLUCOPHAGE Take 1,000 mg by mouth  daily with breakfast.   OneTouch Delica Plus Lancet33G Misc SMARTSIG:1 Topical Daily   OneTouch Ultra Test test strip Generic drug: glucose blood 1 each 3 (three) times daily.        Allergies:  Allergies  Allergen Reactions   Proton Pump Inhibitors     Made him feel bad     Social History:  reports that he has never smoked. He has never used smokeless tobacco. He reports that he does not drink alcohol and does not use drugs.   Physical Exam: BP (!) 146/79 (BP Location: Left Arm, Patient Position: Sitting, Cuff Size: Large)   Pulse 74   Ht 6\' 1"  (1.854 m)   Wt 219 lb 9.6 oz (99.6 kg)   BMI 28.97 kg/m   Constitutional:  Alert and oriented, No acute distress. HEENT: [place] AT, moist mucus membranes.  Trachea midline, no masses. Neurologic: Grossly intact, no focal deficits, moving all 4 extremities. Psychiatric: Normal mood and affect.   Pertinent Imaging: EXAM: CT ABDOMEN AND PELVIS WITHOUT AND WITH CONTRAST  TECHNIQUE: Multidetector CT imaging of the abdomen and pelvis was performed following the standard protocol before and following the bolus administration of intravenous contrast.  RADIATION DOSE REDUCTION: This exam was performed according to the departmental dose-optimization program which includes automated exposure control, adjustment of the mA and/or  kV according to patient size and/or use of iterative reconstruction technique.  CONTRAST:  OMNIPAQUE IOHEXOL 300 MG/ML  SOLN  COMPARISON:  None Available.  FINDINGS: Lower chest: Lung bases are clear.  Hepatobiliary: 10 mm inferior right hepatic lobe cyst (series 7/image 33), benign.  Gallbladder is unremarkable. No intrahepatic or extrahepatic ductal dilatation.  Pancreas: Within normal limits.  Spleen: Within normal limits.  Adrenals/Urinary Tract: Adrenal glands are within normal limits.  Kidneys are within normal limits.  2.2 cm partial staghorn left lower pole renal calculus  (sagittal image 100). Additional 2 mm nonobstructing interpolar left renal calculus (series 2/image 40). No ureteral calculi. No hydronephrosis.  On delayed imaging, there are no filling defects in the bilateral opacified proximal collecting systems, ureters, or bladder.  2.2 cm stellate calculus in the bladder (series 2/image 88). Thick-walled bladder, although underdistended, favoring chronic bladder obstruction given additional findings.  Stomach/Bowel: Stomach is within normal limits.  No evidence of bowel obstruction.  Normal appendix (series 7/image 59).  Mild left colonic diverticulosis, without evidence of diverticulitis.  Vascular/Lymphatic: No evidence of abdominal aortic aneurysm.  Atherosclerotic calcifications of the abdominal aorta and branch vessels, although vessels remain patent.  No suspicious abdominopelvic lymphadenopathy.  Reproductive: Prostatomegaly, with enlargement of the central gland indenting the base of the bladder, suggesting BPH.  Other: No abdominopelvic ascites.  Musculoskeletal: Degenerative changes of the visualized thoracolumbar spine.  IMPRESSION: 2.2 cm partial staghorn left lower pole renal calculus. Additional 2 mm nonobstructing interpolar left renal calculus. No ureteral calculi or hydronephrosis.  Additional 2.2 cm bladder calculus.  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   Electronically Signed By: Charline Bills M.D. On: 08/26/2023 02:25  This was personally reviewed and I agree with the radiologic interpretation.   Assessment & Plan:    1. Bladder stone/ BPH with urinary obstruction - Informed of the risks and benefits of various procedures, including potential ejaculatory issues, catheter use, bleeding, and incontinence.  - He has opted for the HoLEP procedure, which is durable and typically lasts about 15 years. T - The plan is to perform the stone removal and prostate procedure  simultaneously. -  Urine culture will be sent for preoperative assessment.  - The surgical scheduler will contact him to arrange the procedure, which is expected to take approximately two and a half hours. - Post operative instructions were discussed. He will have a catheter for approximately two days post-surgery. Most patients are discharged the same day unless there is significant hematuria. He is advised to increase fluid intake to prevent clotting and obstruction of the catheter. Walking is encouraged post-surgery, but heavy lifting should be avoided to prevent rebleeding. He is informed about potential postoperative symptoms, including stinging, burning, and stress incontinence, and is advised to start pelvic floor exercises to strengthen the pelvic muscles.  2. Kidney stone - 2.2 cm stone in the left lower pole of the kidney. It is not currently causing obstruction but occupies a significant portion of the kidney.  - The plan is to address this at a later date, potentially with a more invasive procedure or observation.  Return for bladder stone removal and HoLEP.  I have reviewed the above documentation for accuracy and completeness, and I agree with the above.   Charles Scotland, MD    [facility] [address] [place], [place] [address] [phone]

## 2023-10-14 NOTE — H&P (View-Only) (Signed)
 Marcelle Overlie Plume,acting as a scribe for [name], MD.,have documented all relevant documentation on the behalf of [name], MD,as directed by  [name], MD while in the presence of [name], MD.  10/14/23 12:35 PM   Charles Mccullough 1956-01-08 [id number]  Referring provider: Marguarite Arbour, MD [address] [place] [facility] [place],  [place] [address]  Chief Complaint  Patient presents with   Pre-op Exam    HPI: 68 year-old male referred for evaluation and management of a large bladder stone, specifically a jackstone, kidney stone and BPH. He presents with a significant bladder stone, which is causing urinary symptoms due to incomplete bladder emptying.   He also has a large kidney stone in the left lower pole, measuring 2.2 cm, which is not currently causing obstruction but will need to be addressed in the future.    He is generally healthy, exercises regularly, and is not overweight, which is favorable for surgical outcomes.   He is accompanied today by his wife.   PMH: Past Medical History:  Diagnosis Date   Diabetes mellitus without complication (HCC)    Hypertension    Kidney stones     Surgical History: Past Surgical History:  Procedure Laterality Date   KIDNEY STONE SURGERY      Home Medications:  Allergies as of 10/14/2023       Reactions   Proton Pump Inhibitors    Made him feel bad        Medication List        Accurate as of October 14, 2023 12:35 PM. If you have any questions, ask your nurse or doctor.          aspirin EC 81 MG tablet Take 81 mg by mouth daily.   atorvastatin 10 MG tablet Commonly known as: LIPITOR Take 1 tablet by mouth daily.   glimepiride 4 MG tablet Commonly known as: AMARYL Take 4 mg by mouth daily with breakfast.   losartan 100 MG tablet Commonly known as: COZAAR Take 100 mg by mouth daily.   metFORMIN 1000 MG tablet Commonly known as: GLUCOPHAGE Take 1,000 mg by mouth  daily with breakfast.   OneTouch Delica Plus Lancet33G Misc SMARTSIG:1 Topical Daily   OneTouch Ultra Test test strip Generic drug: glucose blood 1 each 3 (three) times daily.        Allergies:  Allergies  Allergen Reactions   Proton Pump Inhibitors     Made him feel bad     Social History:  reports that he has never smoked. He has never used smokeless tobacco. He reports that he does not drink alcohol and does not use drugs.   Physical Exam: BP (!) 146/79 (BP Location: Left Arm, Patient Position: Sitting, Cuff Size: Large)   Pulse 74   Ht 6\' 1"  (1.854 m)   Wt 219 lb 9.6 oz (99.6 kg)   BMI 28.97 kg/m   Constitutional:  Alert and oriented, No acute distress. HEENT: [place] AT, moist mucus membranes.  Trachea midline, no masses. Neurologic: Grossly intact, no focal deficits, moving all 4 extremities. Psychiatric: Normal mood and affect.   Pertinent Imaging: EXAM: CT ABDOMEN AND PELVIS WITHOUT AND WITH CONTRAST  TECHNIQUE: Multidetector CT imaging of the abdomen and pelvis was performed following the standard protocol before and following the bolus administration of intravenous contrast.  RADIATION DOSE REDUCTION: This exam was performed according to the departmental dose-optimization program which includes automated exposure control, adjustment of the mA and/or  kV according to patient size and/or use of iterative reconstruction technique.  CONTRAST:  OMNIPAQUE IOHEXOL 300 MG/ML  SOLN  COMPARISON:  None Available.  FINDINGS: Lower chest: Lung bases are clear.  Hepatobiliary: 10 mm inferior right hepatic lobe cyst (series 7/image 33), benign.  Gallbladder is unremarkable. No intrahepatic or extrahepatic ductal dilatation.  Pancreas: Within normal limits.  Spleen: Within normal limits.  Adrenals/Urinary Tract: Adrenal glands are within normal limits.  Kidneys are within normal limits.  2.2 cm partial staghorn left lower pole renal calculus  (sagittal image 100). Additional 2 mm nonobstructing interpolar left renal calculus (series 2/image 40). No ureteral calculi. No hydronephrosis.  On delayed imaging, there are no filling defects in the bilateral opacified proximal collecting systems, ureters, or bladder.  2.2 cm stellate calculus in the bladder (series 2/image 88). Thick-walled bladder, although underdistended, favoring chronic bladder obstruction given additional findings.  Stomach/Bowel: Stomach is within normal limits.  No evidence of bowel obstruction.  Normal appendix (series 7/image 59).  Mild left colonic diverticulosis, without evidence of diverticulitis.  Vascular/Lymphatic: No evidence of abdominal aortic aneurysm.  Atherosclerotic calcifications of the abdominal aorta and branch vessels, although vessels remain patent.  No suspicious abdominopelvic lymphadenopathy.  Reproductive: Prostatomegaly, with enlargement of the central gland indenting the base of the bladder, suggesting BPH.  Other: No abdominopelvic ascites.  Musculoskeletal: Degenerative changes of the visualized thoracolumbar spine.  IMPRESSION: 2.2 cm partial staghorn left lower pole renal calculus. Additional 2 mm nonobstructing interpolar left renal calculus. No ureteral calculi or hydronephrosis.  Additional 2.2 cm bladder calculus.  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   Electronically Signed By: Charline Bills M.D. On: 08/26/2023 02:25  This was personally reviewed and I agree with the radiologic interpretation.   Assessment & Plan:    1. Bladder stone/ BPH with urinary obstruction - Informed of the risks and benefits of various procedures, including potential ejaculatory issues, catheter use, bleeding, and incontinence.  - He has opted for the HoLEP procedure, which is durable and typically lasts about 15 years. T - The plan is to perform the stone removal and prostate procedure  simultaneously. -  Urine culture will be sent for preoperative assessment.  - The surgical scheduler will contact him to arrange the procedure, which is expected to take approximately two and a half hours. - Post operative instructions were discussed. He will have a catheter for approximately two days post-surgery. Most patients are discharged the same day unless there is significant hematuria. He is advised to increase fluid intake to prevent clotting and obstruction of the catheter. Walking is encouraged post-surgery, but heavy lifting should be avoided to prevent rebleeding. He is informed about potential postoperative symptoms, including stinging, burning, and stress incontinence, and is advised to start pelvic floor exercises to strengthen the pelvic muscles.  2. Kidney stone - 2.2 cm stone in the left lower pole of the kidney. It is not currently causing obstruction but occupies a significant portion of the kidney.  - The plan is to address this at a later date, potentially with a more invasive procedure or observation.  Return for bladder stone removal and HoLEP.  I have reviewed the above documentation for accuracy and completeness, and I agree with the above.   [name], MD    [facility] [address] [place], [place] [address] [phone]

## 2023-10-18 ENCOUNTER — Other Ambulatory Visit: Payer: Self-pay

## 2023-10-18 ENCOUNTER — Telehealth: Payer: Self-pay

## 2023-10-18 DIAGNOSIS — N210 Calculus in bladder: Secondary | ICD-10-CM

## 2023-10-18 DIAGNOSIS — N138 Other obstructive and reflux uropathy: Secondary | ICD-10-CM

## 2023-10-18 LAB — CULTURE, URINE COMPREHENSIVE

## 2023-10-18 NOTE — Progress Notes (Signed)
 Surgical Physician Order Form  [facility]  Dr. [name], MD  * Scheduling expectation : Next Available  *Length of Case:   *Clearance needed: no  *Anticoagulation Instructions: Hold all anticoagulants  *Aspirin Instructions: Hold Aspirin  *Post-op visit Date/Instructions: 2 days Foley catheter removal, 6 weeks IPSS/PVR  *Diagnosis: BPH with urinary obstruction, bladder stone  *Procedure:  Cystolitholapaxy >2.5cm ([address]), HoLEP   Additional orders: N/A  -Admit type: OUTpatient  -Anesthesia: General  -VTE Prophylaxis Standing Order SCD's       Other:   -Standing Lab Orders Per Anesthesia    Lab other: None  -Standing Test orders EKG/Chest x-ray per Anesthesia       Test other:   - Medications:  Ancef 2gm IV  -Other orders:  N/A

## 2023-10-18 NOTE — Progress Notes (Signed)
   [facility] Surgical Posting Form  Surgery Date: Date: 11/01/2023  Surgeon: Dr. [name], MD  Inpt ( No  )   Outpt (Yes)   Obs ( No  )   Diagnosis: N40.1, N13.8 Benign Prostatic Hyperplasia with Urinary Obstruction, N21.0 Bladder Stone  -CPT: [address], [phone]  Surgery: Holmium Laser Enucleation of the Prostate and Cystolitholapaxy  Stop Anticoagulations: Yes and hold ASA  Cardiac/Medical/Pulmonary Clearance needed: yes  Clearance needed from Dr: Darrold Junker  Clearance request sent on: Date: 10/18/23  *Orders entered into EPIC  Date: 10/18/23   *Case booked in EPIC  Date: 10/18/23  *Notified pt of Surgery: Date: 10/18/23  PRE-OP UA & CX: no  *Placed into Prior Authorization Work Ruch Date: 10/18/23  Assistant/laser/rep:No

## 2023-10-18 NOTE — Telephone Encounter (Signed)
  Per Dr. Apolinar Junes, Patient is to be scheduled for Holmium Laser Enucleation of the Prostate and Cystolitholapaxy  Charles Mccullough was contacted and possible surgical dates were discussed, Monday March 17th, 2025 was agreed upon for surgery.   Patient was directed to call [phone] between 1-3pm the day before surgery to find out surgical arrival time.  Instructions were given not to eat or drink from midnight on the night before surgery and have a driver for the day of surgery. On the surgery day patient was instructed to enter through the Medical Mall entrance of [facility] report the Same Day Surgery desk.   Pre-Admit Testing will be in contact via phone to set up an interview with the anesthesia team to review your history and medications prior to surgery.   Reminder of this information was sent via MyChart to the patient.

## 2023-10-18 NOTE — Progress Notes (Signed)
  Phone Number: [phone] for Surgical Coordinator Fax Number: [phone]  REQUEST FOR SURGICAL CLEARANCE       Date: Date: 10/18/23  Faxed to: Dr. Darrold Junker, MD  Surgeon: Dr. [name], MD     Date of Surgery: 11/01/2023  Operation: Holmium Laser Enucleation of the Prostate and Cystolitholapaxy  Anesthesia Type: General   Diagnosis: Benign Prostatic Hyperplasia with Urinary Obstruction and Bladder Stone  Patient Requires:   Cardiac / Vascular Clearance : Yes  Reason: Patient will need to hold 81mg  ASA prior to surgery, how many days do you recommend.   Risk Assessment:    Low   []       Moderate   []     High   []           This patient is optimized for surgery  YES []       NO   []    I recommend further assessment/workup prior to surgery. YES []      NO  []   Appointment scheduled for: _______________________   Further recommendations: ____________________________________     Physician Signature:__________________________________   Printed Name: ________________________________________   Date: _________________

## 2023-10-19 ENCOUNTER — Telehealth: Payer: Self-pay

## 2023-10-19 NOTE — Telephone Encounter (Signed)
 Received a clearance from Dr. Darrold Junker. Patient has been advised to hold his Aspirin 5 days prior to surgery. Pt. Verbalized understanding.

## 2023-10-22 ENCOUNTER — Other Ambulatory Visit: Payer: Self-pay

## 2023-10-22 ENCOUNTER — Encounter
Admission: RE | Admit: 2023-10-22 | Discharge: 2023-10-22 | Disposition: A | Discharge status: Home / Self Care | Source: Ambulatory Visit | Attending: Urology | Admitting: Urology

## 2023-10-22 DIAGNOSIS — I10 Essential (primary) hypertension: Secondary | ICD-10-CM

## 2023-10-22 DIAGNOSIS — Z01810 Encounter for preprocedural cardiovascular examination: Secondary | ICD-10-CM | POA: Diagnosis present

## 2023-10-22 DIAGNOSIS — Z01818 Encounter for other preprocedural examination: Secondary | ICD-10-CM | POA: Diagnosis not present

## 2023-10-22 DIAGNOSIS — E119 Type 2 diabetes mellitus without complications: Secondary | ICD-10-CM | POA: Diagnosis not present

## 2023-10-22 DIAGNOSIS — Z01812 Encounter for preprocedural laboratory examination: Secondary | ICD-10-CM | POA: Diagnosis present

## 2023-10-22 HISTORY — DX: Other complications of anesthesia, initial encounter: T88.59XA

## 2023-10-22 HISTORY — DX: Unspecified osteoarthritis, unspecified site: M19.90

## 2023-10-22 HISTORY — DX: Atherosclerotic heart disease of native coronary artery without angina pectoris: I25.10

## 2023-10-22 HISTORY — DX: Calculus in bladder: N21.0

## 2023-10-22 HISTORY — DX: Other obstructive and reflux uropathy: N40.1

## 2023-10-22 LAB — CBC
HCT: 48.6 % (ref 39.0–52.0)
Hemoglobin: 15.9 g/dL (ref 13.0–17.0)
MCH: 30.3 pg (ref 26.0–34.0)
MCHC: 32.7 g/dL (ref 30.0–36.0)
MCV: 92.6 fL (ref 80.0–100.0)
Platelets: 187 10*3/uL (ref 150–400)
RBC: 5.25 MIL/uL (ref 4.22–5.81)
RDW: 13.1 % (ref 11.5–15.5)
WBC: 7.4 10*3/uL (ref 4.0–10.5)
nRBC: 0 % (ref 0.0–0.2)

## 2023-10-22 LAB — BASIC METABOLIC PANEL
Anion gap: 8 (ref 5–15)
BUN: 25 mg/dL — ABNORMAL HIGH (ref 8–23)
CO2: 28 mmol/L (ref 22–32)
Calcium: 9 mg/dL (ref 8.9–10.3)
Chloride: 104 mmol/L (ref 98–111)
Creatinine, Ser: 1.1 mg/dL (ref 0.61–1.24)
GFR, Estimated: >60 mL/min (ref >60)
Glucose, Bld: 192 mg/dL — ABNORMAL HIGH (ref 70–99)
Potassium: 4.1 mmol/L (ref 3.5–5.1)
Sodium: 140 mmol/L (ref 135–145)

## 2023-10-22 NOTE — Patient Instructions (Addendum)
 Your procedure is scheduled on: 11/01/23 - Monday Report to the Registration Desk on the 1st floor of the Medical Mall. To find out your arrival time, please call [phone] between 1PM - 3PM on: 10/29/23 - Friday If your arrival time is 6:00 am, do not arrive before that time as the Medical Mall entrance doors do not open until 6:00 am.  REMEMBER: Instructions that are not followed completely may result in serious medical risk, up to and including death; or upon the discretion of your surgeon and anesthesiologist your surgery may need to be rescheduled.  Do not eat food or drink any liquids after midnight the night before surgery.  No gum chewing or hard candies.  One week prior to surgery: Stop Anti-inflammatories (NSAIDS) such as Advil, Aleve, Ibuprofen, Motrin, Naproxen, Naprosyn and Aspirin based products such as Excedrin, Goody's Powder, BC Powder. You may take Tylenol if needed for pain up until the day of surgery.  Stop ANY OVER THE COUNTER supplements until after surgery.  HOLD metFORMIN (GLUCOPHAGE) beginning 10/30/23.  HOLD aspirin EC beginning 10/27/23.  HOLD losartan (COZAAR) on the day of surgery.   ON THE DAY OF SURGERY ONLY TAKE THESE MEDICATIONS WITH SIPS OF WATER:  none   No Alcohol for 24 hours before or after surgery.  No Smoking including e-cigarettes for 24 hours before surgery.  No chewable tobacco products for at least 6 hours before surgery.  No nicotine patches on the day of surgery.  Do not use any "recreational" drugs for at least a week (preferably 2 weeks) before your surgery.  Please be advised that the combination of cocaine and anesthesia may have negative outcomes, up to and including death. If you test positive for cocaine, your surgery will be cancelled.  On the morning of surgery brush your teeth with toothpaste and water, you may rinse your mouth with mouthwash if you wish. Do not swallow any toothpaste or mouthwash.  Do not wear  jewelry, make-up, hairpins, clips or nail polish.  For welded (permanent) jewelry: bracelets, anklets, waist bands, etc.  Please have this removed prior to surgery.  If it is not removed, there is a chance that hospital personnel will need to cut it off on the day of surgery.  Do not wear lotions, powders, or perfumes.   Do not shave body hair from the neck down 48 hours before surgery.  Contact lenses, hearing aids and dentures may not be worn into surgery.  Do not bring valuables to the hospital. [facility] is not responsible for any missing/lost belongings or valuables.   Notify your doctor if there is any change in your medical condition (cold, fever, infection).  Wear comfortable clothing (specific to your surgery type) to the hospital.  After surgery, you can help prevent lung complications by doing breathing exercises.  Take deep breaths and cough every 1-2 hours. Your doctor may order a device called an Incentive Spirometer to help you take deep breaths. When coughing or sneezing, hold a pillow firmly against your incision with both hands. This is called "splinting." Doing this helps protect your incision. It also decreases belly discomfort.  If you are being admitted to the hospital overnight, leave your suitcase in the car. After surgery it may be brought to your room.  In case of increased patient census, it may be necessary for you, the patient, to continue your postoperative care in the Same Day Surgery department.  If you are being discharged the day of surgery, you will  not be allowed to drive home. You will need a responsible individual to drive you home and stay with you for 24 hours after surgery.   If you are taking public transportation, you will need to have a responsible individual with you.  Please call the Pre-admissions Testing Dept. at [phone] if you have any questions about these instructions.  Surgery Visitation Policy:  Patients having surgery or  a procedure may have two visitors.  Children under the age of 39 must have an adult with them who is not the patient.  Temporary Visitor Restrictions Due to increasing cases of flu, RSV and COVID-19: Children ages 59 and under will not be able to visit patients in [facility] hospitals under most circumstances.  Inpatient Visitation:    Visiting hours are 7 a.m. to 8 p.m. Up to four visitors are allowed at one time in a patient room. The visitors may rotate out with other people during the day.  One visitor age 31 or older may stay with the patient overnight and must be in the room by 8 p.m.

## 2023-10-29 ENCOUNTER — Encounter: Payer: Self-pay | Admitting: Urology

## 2023-10-29 NOTE — Progress Notes (Signed)
 Perioperative / Anesthesia Services  Pre-Admission Testing Clinical Review / Pre-Operative Anesthesia Consult  Date: 10/29/23  Patient Demographics:  Name: Charles Mccullough DOB: [birth year] MRN:   [id number]  Planned Surgical Procedure(s):    Case: [id number] Date/Time: 11/01/23 0730   Procedures:      ENUCLEATION, PROSTATE, USING LASER, WITH MORCELLATION     CYSTOSCOPY, WITH BLADDER CALCULUS LITHOLAPAXY   Anesthesia type: General   Pre-op diagnosis: Benign Prostatic Hyperplasia with Urinary Obstruction, Bladder Stone   Location: [facility] OR ROOM 10 / [facility]   Surgeons: [name], MD      NOTE: Available PAT nursing documentation and vital signs have been reviewed. Clinical nursing staff has updated patient's PMH/PSHx, current medication list, and drug allergies/intolerances to ensure comprehensive history available to assist in medical decision making as it pertains to the aforementioned surgical procedure and anticipated anesthetic course. Extensive review of available clinical information personally performed. [place] [facility] and PSHx updated with any diagnoses/procedures that  may have been inadvertently omitted during his intake with the pre-admission testing department's nursing staff.  Clinical Discussion:  Charles Mccullough is a 68 y.o. male who is submitted for pre-surgical anesthesia review and clearance prior to him undergoing the above procedure. Patient has never been a smoker in the past. Pertinent PMH includes: CAD, diastolic dysfunction, aortic atherosclerosis, RBBB, HTN, HLD, T2DM, nephrolithiasis, BPH with BOO, OA.  Patient is followed by cardiology Darrold Junker, MD). He was last seen in the cardiology clinic on 07/27/2019; notes reviewed. At the time of his clinic visit, patient doing well overall from a cardiovascular perspective. Patient denied any chest pain, shortness of breath, PND, orthopnea, palpitations, significant peripheral edema, weakness,  fatigue, vertiginous symptoms, or presyncope/syncope. Patient with a past medical history significant for cardiovascular diagnoses. Documented physical exam was grossly benign, providing no evidence of acute exacerbation and/or decompensation of the patient's known cardiovascular conditions.  Most recent TTE performed on 11/10/2019 revealed a normal left ventricular systolic function with an EF of >55%. There were no regional wall motion abnormalities. Left ventricular diastolic Doppler parameters consistent with abnormal relaxation (G1DD).  Right ventricular size and function normal There was mild mitral annular calcification. There was trivial panvalvular regurgitation. All transvalvular gradients were noted to be normal providing no evidence suggestive of valvular stenosis. Aorta normal in size with no evidence of ectasia or aneurysmal dilatation.  Blood pressure well controlled at 122/70 mmHg on currently prescribed ARB (losartan) monotherapy.  Patient is on atorvastatin for his HLD diagnosis and ASCVD prevention. T2DM well controlled on currently prescribed regimen; last HgbA1c was 6.0% when checked on 07/06/2023. He does not have an OSAH diagnosis.  Patient is very active at baseline.  He walks at least 3 miles most days.  Patient with concerns of heart disease due to a strong familial history.  Patient is able to complete all of his ADLs/ADLs independently without cardiovascular limitation.  Per the [facility], patient is able to exceed 4 METS of physical activity without experiencing any significant degrees of angina/anginal equivalent symptoms.  No changes were made to his medication regimen.  Patient to follow-up with outpatient cardiology in 1 year or sooner if needed.  Ilene Mccullough is scheduled for an elective ENUCLEATION, PROSTATE, USING LASER, WITH MORCELLATION; CYSTOSCOPY, WITH BLADDER CALCULUS LITHOLAPAXY on 11/01/2023 with Dr. [name], MD.  Given patient's past medical history significant  for cardiovascular diagnoses, presurgical cardiac clearance was sought by the PAT team. Per cardiology, "this patient is optimized for surgery  and may proceed with the planned procedural course with a LOW risk of significant perioperative cardiovascular complications".  In review of the patient's chart, it is noted that he is on daily oral antithrombotic therapy. He has been instructed on recommendations for holding his daily low dose ASA for 5 days prior to his procedure with plans to restart as soon as postoperative bleeding risk felt to be minimized by his attending surgeon. The patient has been instructed that his last dose of ASA should be on 10/26/2023.  Patient denies previous perioperative complications with anesthesia in the past. In review his EMR, there are no records available for review pertaining to any anesthetic courses within the [facility] system in the recent past.      10/14/2023   11:11 AM 09/14/2023    9:48 AM 07/29/2023    8:49 AM  Vitals with BMI  Height 6\' 1"  6\' 1"  6\' 1"   Weight 219 lbs 10 oz 212 lbs 211 lbs 8 oz  BMI 28.98 27.98 27.91  Systolic 146 105 161  Diastolic 79 64 73  Pulse 74 80 91   Providers/Specialists:  NOTE: Primary physician provider listed below. Patient may have been seen by APP or partner within same practice.   PROVIDER ROLE / SPECIALTY LAST Jaquelyn Bitter, MD Urology (Surgeon) 10/14/2023  Marguarite Arbour, MD Primary Care Provider 07/06/2023  Marcina Millard, MD Cardiology 07/27/2023   Allergies:   Allergies  Allergen Reactions   Proton Pump Inhibitors     Made him feel bad   Current Home Medications:   No current facility-administered medications for this encounter.    aspirin EC 81 MG tablet   atorvastatin (LIPITOR) 10 MG tablet   cyanocobalamin (VITAMIN B12) 1000 MCG tablet   glimepiride (AMARYL) 4 MG tablet   losartan (COZAAR) 100 MG tablet   metFORMIN (GLUCOPHAGE) 1000 MG tablet   Lancets (ONETOUCH DELICA PLUS  LANCET33G) MISC   ONETOUCH ULTRA TEST test strip   History:   Past Medical History:  Diagnosis Date   Aortic atherosclerosis (HCC)    Arthritis    Bladder stone    BPH with urinary obstruction    Complication of anesthesia    a.) prolonged/delayed emergence   Coronary artery disease    Diastolic dysfunction    a.) TTE 11/10/2019: EF >55%, no RWMAs, G1DD, norm RVSF, mild MAC, triv panval regurg   Diverticulosis    History of bilateral cataract extraction    HLD (hyperlipidemia)    Hypertension    Kidney stones 2004   Long-term use of aspirin therapy    Pyelonephritis 2004   RBBB (right bundle branch block)    Staghorn calculus    T2DM (type 2 diabetes mellitus) (HCC)    Past Surgical History:  Procedure Laterality Date   COLONOSCOPY     x 3   COLONOSCOPY W/ POLYPECTOMY     EXTENSOR TENDON OF FOREARM / WRIST REPAIR Right    EYE SURGERY Bilateral    cataracts with lens replace   KIDNEY STONE SURGERY     No family history on file. Social History   Tobacco Use   Smoking status: Never   Smokeless tobacco: Never  Substance Use Topics   Alcohol use: Never   Pertinent Clinical Results:  LABS:  Hospital Outpatient Visit on 10/22/2023  Component Date Value Ref Range Status   WBC 10/22/2023 7.4  4.0 - 10.5 K/uL Final   RBC 10/22/2023 5.25  4.22 - 5.81 MIL/uL Final  Hemoglobin 10/22/2023 15.9  13.0 - 17.0 g/dL Final   HCT 29/56/2130 48.6  39.0 - 52.0 % Final   MCV 10/22/2023 92.6  80.0 - 100.0 fL Final   MCH 10/22/2023 30.3  26.0 - 34.0 pg Final   MCHC 10/22/2023 32.7  30.0 - 36.0 g/dL Final   RDW 86/57/8469 13.1  11.5 - 15.5 % Final   Platelets 10/22/2023 187  150 - 400 K/uL Final   nRBC 10/22/2023 0.0  0.0 - 0.2 % Final   Performed at [facility], [address] [place], [place] [address]   Sodium 10/22/2023 140  135 - 145 mmol/L Final   Potassium 10/22/2023 4.1  3.5 - 5.1 mmol/L Final   Chloride 10/22/2023 104  98 - 111 mmol/L Final   CO2 10/22/2023  28  22 - 32 mmol/L Final   Glucose, Bld 10/22/2023 192 (H)  70 - 99 mg/dL Final   Glucose reference range applies only to samples taken after fasting for at least 8 hours.   BUN 10/22/2023 25 (H)  8 - 23 mg/dL Final   Creatinine, Ser 10/22/2023 1.10  0.61 - 1.24 mg/dL Final   Calcium 84/13/2440 9.0  8.9 - 10.3 mg/dL Final   GFR, Estimated 10/22/2023 >60  >60 mL/min Final   Comment: (NOTE) Calculated using the CKD-EPI Creatinine Equation (2021)    Anion gap 10/22/2023 8  5 - 15 Final   Performed at [facility], [address] [place], [place] [address]  Office Visit on 10/14/2023  Component Date Value Ref Range Status   Specific Gravity, UA 10/14/2023 >1.030 (H)  1.005 - 1.030 Final   pH, UA 10/14/2023 5.5  5.0 - 7.5 Final   Color, UA 10/14/2023 Yellow  Yellow Final   Appearance Ur 10/14/2023 Clear  Clear Final   Leukocytes,UA 10/14/2023 Negative  Negative Final   Protein,UA 10/14/2023 Negative  Negative/Trace Final   Glucose, UA 10/14/2023 Trace (A)  Negative Final   Ketones, UA 10/14/2023 Negative  Negative Final   RBC, UA 10/14/2023 3+ (A)  Negative Final   Bilirubin, UA 10/14/2023 Negative  Negative Final   Urobilinogen, Ur 10/14/2023 1.0  0.2 - 1.0 mg/dL Final   Nitrite, UA 53/66/4403 Negative  Negative Final   Microscopic Examination 10/14/2023 See below:   Final   Urine Culture, Comprehensive 10/14/2023 Final report   Final   Organism ID, Bacteria 10/14/2023 Comment   Final   No growth in 36 - 48 hours.   WBC, UA 10/14/2023 0-5  0 - 5 /hpf Final   RBC, Urine 10/14/2023 11-30 (A)  0 - 2 /hpf Final   Epithelial Cells (non renal) 10/14/2023 0-10  0 - 10 /hpf Final   Bacteria, UA 10/14/2023 Few  None seen/Few Final    ECG: Date: 10/22/2023 Time ECG obtained: 1535 PM Rate: 69 bpm Rhythm: normal sinus Axis (leads I and aVF): normal Intervals: PR 174 ms. QRS 84 ms. QTc 411 ms. ST segment and T wave changes: No evidence of acute T wave abnormalities or significant  ST segment elevation or depression.  Evidence of a possible, age undetermined, prior infarct:  No Comparison: Similar to previous tracing obtained on 05/26/2010   IMAGING / PROCEDURES: CT HEMATURIA WORKUP performed on 08/16/2023 2.2 cm partial staghorn left lower pole renal calculus.  Additional 2 mm nonobstructing interpolar left renal calculus.  No ureteral calculi or hydronephrosis. Additional 2.2 cm bladder calculus. 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   TRANSTHORACIC  ECHOCARDIOGRAM performed on 11/10/2019 Normal left ventricular systolic function with an EF of  >55% No regional wall motion abnormalities Left ventricular diastolic Doppler parameters consistent with abnormal relaxation (G1DD). Right ventricular size and function normal Mild mitral annular calcification Trivial pan valvular regurgitation Normal transvalvular gradients; no valvular stenosis No pericardial effusion  Impression and Plan:  Charles Mccullough has been referred for pre-anesthesia review and clearance prior to him undergoing the planned anesthetic and procedural courses. Available labs, pertinent testing, and imaging results were personally reviewed by me in preparation for upcoming operative/procedural course. [facility] medical record has been updated following extensive record review and patient interview with PAT staff.   This patient has been appropriately cleared by cardiology with an overall LOW risk of experiencing significant perioperative cardiovascular complications. Based on clinical review performed today (10/29/23), barring any significant acute changes in the patient's overall condition, it is anticipated that he will be able to proceed with the planned surgical intervention. Any acute changes in clinical condition may necessitate his procedure being postponed and/or cancelled. Patient will meet with anesthesia team (MD and/or CRNA) on the day of his procedure for  preoperative evaluation/assessment. Questions regarding anesthetic course will be fielded at that time.   Pre-surgical instructions were reviewed with the patient during his PAT appointment, and questions were fielded to satisfaction by PAT clinical staff. He has been instructed on which medications that he will need to hold prior to surgery, as well as the ones that have been deemed safe/appropriate to take on the day of his procedure. As part of the general education provided by PAT, patient made aware both verbally and in writing, that he would need to abstain from the use of any illegal substances during his perioperative course. He was advised that failure to follow the provided instructions could necessitate case cancellation or result in serious perioperative complications up to and including death. Patient encouraged to contact PAT and/or his surgeon's office to discuss any questions or concerns that may arise prior to surgery; verbalized understanding.   Quentin Mulling, MSN, APRN, FNP-C, CEN [facility]  Perioperative Services Nurse Practitioner Phone: [phone] Fax: [phone] 10/29/23 4:29 PM  NOTE: This note has been prepared using Dragon dictation software. Despite my best ability to proofread, there is always the potential that unintentional transcriptional errors may still occur from this process.

## 2023-10-31 MED ORDER — SODIUM CHLORIDE 0.9% FLUSH
3.0000 mL | Freq: Two times a day (BID) | INTRAVENOUS | Status: DC
Start: 2023-10-31 — End: 2023-11-01

## 2023-10-31 MED ORDER — CEFAZOLIN SODIUM-DEXTROSE 2-4 GM/100ML-% IV SOLN
2.0000 g | INTRAVENOUS | Status: AC
  Administered 2023-11-01: 2 g via INTRAVENOUS

## 2023-10-31 MED ORDER — ORAL CARE MOUTH RINSE: 15.0000 mL | Freq: Once | OROMUCOSAL | Status: AC

## 2023-10-31 MED ORDER — CHLORHEXIDINE GLUCONATE 0.12 % MT SOLN
15.0000 mL | Freq: Once | OROMUCOSAL | Status: AC
  Administered 2023-11-01: 15 mL via OROMUCOSAL

## 2023-10-31 MED ORDER — SODIUM CHLORIDE 0.9% FLUSH: 3.0000 mL | INTRAVENOUS | Status: DC | PRN

## 2023-11-01 ENCOUNTER — Encounter: Payer: Self-pay | Admitting: Urology

## 2023-11-01 ENCOUNTER — Ambulatory Visit

## 2023-11-01 ENCOUNTER — Other Ambulatory Visit: Payer: Self-pay

## 2023-11-01 ENCOUNTER — Encounter
Admission: RE | Disposition: A | Discharge status: Home / Self Care | Payer: Self-pay | Source: Ambulatory Visit | Attending: Urology

## 2023-11-01 ENCOUNTER — Ambulatory Visit: Payer: Self-pay | Admitting: Urgent Care

## 2023-11-01 ENCOUNTER — Ambulatory Visit
Admission: RE | Admit: 2023-11-01 | Discharge: 2023-11-01 | Disposition: A | Discharge status: Home / Self Care | Source: Ambulatory Visit | Attending: Urology | Admitting: Urology

## 2023-11-01 DIAGNOSIS — N411 Chronic prostatitis: Secondary | ICD-10-CM | POA: Diagnosis not present

## 2023-11-01 DIAGNOSIS — I2510 Atherosclerotic heart disease of native coronary artery without angina pectoris: Secondary | ICD-10-CM | POA: Insufficient documentation

## 2023-11-01 DIAGNOSIS — R310 Gross hematuria: Secondary | ICD-10-CM | POA: Diagnosis not present

## 2023-11-01 DIAGNOSIS — R3914 Feeling of incomplete bladder emptying: Secondary | ICD-10-CM | POA: Insufficient documentation

## 2023-11-01 DIAGNOSIS — N320 Bladder-neck obstruction: Secondary | ICD-10-CM | POA: Diagnosis not present

## 2023-11-01 DIAGNOSIS — N401 Enlarged prostate with lower urinary tract symptoms: Secondary | ICD-10-CM | POA: Diagnosis present

## 2023-11-01 DIAGNOSIS — R338 Other retention of urine: Secondary | ICD-10-CM | POA: Insufficient documentation

## 2023-11-01 DIAGNOSIS — Z7984 Long term (current) use of oral hypoglycemic drugs: Secondary | ICD-10-CM | POA: Insufficient documentation

## 2023-11-01 DIAGNOSIS — E119 Type 2 diabetes mellitus without complications: Secondary | ICD-10-CM | POA: Diagnosis not present

## 2023-11-01 DIAGNOSIS — G4730 Sleep apnea, unspecified: Secondary | ICD-10-CM | POA: Diagnosis not present

## 2023-11-01 DIAGNOSIS — N138 Other obstructive and reflux uropathy: Secondary | ICD-10-CM | POA: Diagnosis not present

## 2023-11-01 DIAGNOSIS — I10 Essential (primary) hypertension: Secondary | ICD-10-CM | POA: Insufficient documentation

## 2023-11-01 DIAGNOSIS — N210 Calculus in bladder: Secondary | ICD-10-CM

## 2023-11-01 HISTORY — DX: Other ill-defined heart diseases: I51.89

## 2023-11-01 HISTORY — DX: Unspecified right bundle-branch block: I45.10

## 2023-11-01 HISTORY — DX: Calculus of kidney: N20.0

## 2023-11-01 HISTORY — DX: Long term (current) use of aspirin: Z79.82

## 2023-11-01 HISTORY — DX: Cataract extraction status, right eye: Z98.41

## 2023-11-01 HISTORY — DX: Type 2 diabetes mellitus without complications: E11.9

## 2023-11-01 HISTORY — DX: Hyperlipidemia, unspecified: E78.5

## 2023-11-01 HISTORY — DX: Atherosclerosis of aorta: I70.0

## 2023-11-01 HISTORY — DX: Diverticulosis of intestine, part unspecified, without perforation or abscess without bleeding: K57.90

## 2023-11-01 LAB — GLUCOSE, CAPILLARY
Glucose-Capillary: 97 mg/dL (ref 70–99)
Glucose-Capillary: 107 mg/dL — ABNORMAL HIGH (ref 70–99)

## 2023-11-01 SURGERY — ENUCLEATION, PROSTATE, USING LASER, WITH MORCELLATION
Anesthesia: General | Site: Prostate

## 2023-11-01 MED ORDER — ACETAMINOPHEN 10 MG/ML IV SOLN
INTRAVENOUS | Status: AC
  Filled 2023-11-01: qty 100

## 2023-11-01 MED ORDER — METOPROLOL TARTRATE 5 MG/5ML IV SOLN
INTRAVENOUS | Status: DC | PRN
  Administered 2023-11-01: 1 mg via INTRAVENOUS
  Administered 2023-11-01: 2 mg via INTRAVENOUS

## 2023-11-01 MED ORDER — FUROSEMIDE 10 MG/ML IJ SOLN
INTRAMUSCULAR | Status: DC | PRN
  Administered 2023-11-01: 10 mg via INTRAMUSCULAR

## 2023-11-01 MED ORDER — CEFAZOLIN SODIUM-DEXTROSE 2-4 GM/100ML-% IV SOLN
INTRAVENOUS | Status: AC
  Filled 2023-11-01: qty 100

## 2023-11-01 MED ORDER — PROPOFOL 1000 MG/100ML IV EMUL
INTRAVENOUS | Status: AC
  Filled 2023-11-01: qty 100

## 2023-11-01 MED ORDER — FENTANYL CITRATE (PF) 100 MCG/2ML IJ SOLN: 25.0000 ug | INTRAMUSCULAR | Status: DC | PRN

## 2023-11-01 MED ORDER — LIDOCAINE HCL (CARDIAC) PF 100 MG/5ML IV SOSY
PREFILLED_SYRINGE | INTRAVENOUS | Status: DC | PRN
Start: 2023-11-01 — End: 2023-11-01
  Administered 2023-11-01: 100 mg via INTRAVENOUS

## 2023-11-01 MED ORDER — HYDROCODONE-ACETAMINOPHEN 5-325 MG PO TABS: 1.0000 | ORAL_TABLET | Freq: Four times a day (QID) | ORAL | 0 refills | Status: DC | PRN

## 2023-11-01 MED ORDER — PHENYLEPHRINE HCL-NACL 20-0.9 MG/250ML-% IV SOLN
INTRAVENOUS | Status: AC
  Filled 2023-11-01: qty 250

## 2023-11-01 MED ORDER — OXYCODONE HCL 5 MG/5ML PO SOLN: 5.0000 mg | Freq: Once | ORAL | Status: DC | PRN

## 2023-11-01 MED ORDER — SODIUM CHLORIDE 0.9 % IR SOLN
Status: DC | PRN
  Administered 2023-11-01: 6000 mL via INTRAVESICAL
  Administered 2023-11-01: 12000 mL via INTRAVESICAL
  Administered 2023-11-01: 6000 mL via INTRAVESICAL
  Administered 2023-11-01: 12000 mL via INTRAVESICAL
  Administered 2023-11-01: 6000 mL via INTRAVESICAL

## 2023-11-01 MED ORDER — DEXAMETHASONE SODIUM PHOSPHATE 10 MG/ML IJ SOLN
INTRAMUSCULAR | Status: DC | PRN
  Administered 2023-11-01: 10 mg via INTRAVENOUS

## 2023-11-01 MED ORDER — PROPOFOL 10 MG/ML IV BOLUS
INTRAVENOUS | Status: DC | PRN
Start: 2023-11-01 — End: 2023-11-01
  Administered 2023-11-01: 150 mg via INTRAVENOUS
  Administered 2023-11-01: 30 mg via INTRAVENOUS

## 2023-11-01 MED ORDER — OXYBUTYNIN CHLORIDE 5 MG PO TABS
5.0000 mg | ORAL_TABLET | Freq: Once | ORAL | Status: AC
Start: 2023-11-01 — End: 2023-11-01
  Administered 2023-11-01: 5 mg via ORAL
  Filled 2023-11-01: qty 1

## 2023-11-01 MED ORDER — LACTATED RINGERS IV SOLN: INTRAVENOUS | Status: DC | PRN

## 2023-11-01 MED ORDER — OXYCODONE HCL 5 MG PO TABS: 5.0000 mg | ORAL_TABLET | Freq: Once | ORAL | Status: DC | PRN

## 2023-11-01 MED ORDER — OXYBUTYNIN CHLORIDE 5 MG PO TABS
ORAL_TABLET | ORAL | Status: AC
  Filled 2023-11-01: qty 1

## 2023-11-01 MED ORDER — ROCURONIUM BROMIDE 100 MG/10ML IV SOLN
INTRAVENOUS | Status: DC | PRN
  Administered 2023-11-01: 20 mg via INTRAVENOUS
  Administered 2023-11-01: 50 mg via INTRAVENOUS
  Administered 2023-11-01: 20 mg via INTRAVENOUS

## 2023-11-01 MED ORDER — ACETAMINOPHEN 10 MG/ML IV SOLN
INTRAVENOUS | Status: DC | PRN
  Administered 2023-11-01: 1000 mg via INTRAVENOUS

## 2023-11-01 MED ORDER — SEVOFLURANE IN SOLN
RESPIRATORY_TRACT | Status: AC
  Filled 2023-11-01: qty 250

## 2023-11-01 MED ORDER — SODIUM CHLORIDE 0.9 % IV SOLN: INTRAVENOUS | Status: DC | PRN

## 2023-11-01 MED ORDER — SUGAMMADEX SODIUM 200 MG/2ML IV SOLN
INTRAVENOUS | Status: DC | PRN
  Administered 2023-11-01: 200 mg via INTRAVENOUS

## 2023-11-01 MED ORDER — FENTANYL CITRATE (PF) 100 MCG/2ML IJ SOLN
INTRAMUSCULAR | Status: AC
  Filled 2023-11-01: qty 2

## 2023-11-01 MED ORDER — EPHEDRINE SULFATE (PRESSORS) 50 MG/ML IJ SOLN
INTRAMUSCULAR | Status: DC | PRN
  Administered 2023-11-01: 5 mg via INTRAVENOUS
  Administered 2023-11-01: 10 mg via INTRAVENOUS

## 2023-11-01 MED ORDER — CHLORHEXIDINE GLUCONATE 0.12 % MT SOLN
OROMUCOSAL | Status: AC
  Filled 2023-11-01: qty 15

## 2023-11-01 MED ORDER — OXYBUTYNIN CHLORIDE 5 MG PO TABS: 5.0000 mg | ORAL_TABLET | Freq: Three times a day (TID) | ORAL | 0 refills | Status: DC | PRN

## 2023-11-01 MED ORDER — FENTANYL CITRATE (PF) 100 MCG/2ML IJ SOLN
INTRAMUSCULAR | Status: DC | PRN
Start: 2023-11-01 — End: 2023-11-01
  Administered 2023-11-01: 100 ug via INTRAVENOUS

## 2023-11-01 MED ORDER — ONDANSETRON HCL 4 MG/2ML IJ SOLN
INTRAMUSCULAR | Status: DC | PRN
  Administered 2023-11-01: 4 mg via INTRAVENOUS

## 2023-11-01 SURGICAL SUPPLY — 35 items
ADAPTER IRRIG TUBE 2 SPIKE SOL (ADAPTER) ×4 IMPLANT
BAG DRAIN SIEMENS DORNER NS (MISCELLANEOUS) ×2 IMPLANT
BAG URINE DRAIN 2000ML AR STRL (UROLOGICAL SUPPLIES) IMPLANT
BAG URO DRAIN 4000ML (MISCELLANEOUS) IMPLANT
BASKET ZERO TIP 1.9FR (BASKET) ×2 IMPLANT
CATH FOL 2WAY LX 20X30 (CATHETERS) IMPLANT
CATH FOL 2WAY LX 22X30 (CATHETERS) IMPLANT
CATH FOLEY 3WAY 30CC 22FR (CATHETERS) IMPLANT
CATH URETL OPEN END 4X70 (CATHETERS) ×2 IMPLANT
CONTAINER COLLECT MORCELLATR (MISCELLANEOUS) ×2 IMPLANT
DRAPE SHEET LG 3/4 BI-LAMINATE (DRAPES) ×2 IMPLANT
DRAPE UTILITY 15X26 TOWEL STRL (DRAPES) IMPLANT
FIBER LASER MOSES 365 DFL (Laser) ×2 IMPLANT
FIBER LASER MOSES 550 DFL (Laser) ×2 IMPLANT
FILTER OVERFLOW MORCELLATOR (FILTER) ×2 IMPLANT
GLOVE BIO SURGEON STRL SZ 6.5 (GLOVE) ×4 IMPLANT
GOWN STRL REUS W/ TWL LRG LVL3 (GOWN DISPOSABLE) ×4 IMPLANT
HOLDER FOLEY CATH W/STRAP (MISCELLANEOUS) ×2 IMPLANT
KIT TURNOVER CYSTO (KITS) ×2 IMPLANT
MBRN O SEALING YLW 17 FOR INST (MISCELLANEOUS) ×2 IMPLANT
MEMBRANE SLNG YLW 17 FOR INST (MISCELLANEOUS) ×2 IMPLANT
MORCELLATOR COLLECT CONTAINER (MISCELLANEOUS) ×2 IMPLANT
MORCELLATOR OVERFLOW FILTER (FILTER) ×2 IMPLANT
MORCELLATOR ROTATION 4.75 335 (MISCELLANEOUS) ×2 IMPLANT
PACK CYSTO AR (MISCELLANEOUS) ×2 IMPLANT
SET CYSTO W/LG BORE CLAMP LF (SET/KITS/TRAYS/PACK) IMPLANT
SET IRRIG Y TYPE TUR BLADDER L (SET/KITS/TRAYS/PACK) ×2 IMPLANT
SLEEVE PROTECTION STRL DISP (MISCELLANEOUS) ×4 IMPLANT
SOL .9 NS 3000ML IRR UROMATIC (IV SOLUTION) ×8 IMPLANT
SURGILUBE 2OZ TUBE FLIPTOP (MISCELLANEOUS) ×2 IMPLANT
SYR TOOMEY IRRIG 70ML (MISCELLANEOUS) ×2 IMPLANT
SYRINGE TOOMEY IRRIG 70ML (MISCELLANEOUS) ×2 IMPLANT
TUBE PUMP MORCELLATOR PIRANHA (TUBING) ×2 IMPLANT
WATER STERILE IRR 1000ML POUR (IV SOLUTION) ×2 IMPLANT
WATER STERILE IRR 500ML POUR (IV SOLUTION) ×2 IMPLANT

## 2023-11-01 NOTE — Interval H&P Note (Signed)
 History and Physical Interval Note:  11/01/2023 7:21 AM  Charles Mccullough  has presented today for surgery, with the diagnosis of Benign Prostatic Hyperplasia with Urinary Obstruction, Bladder Stone.  The various methods of treatment have been discussed with the patient and family. After consideration of risks, benefits and other options for treatment, the patient has consented to  Procedure(s): ENUCLEATION, PROSTATE, USING LASER, WITH MORCELLATION (N/A) CYSTOSCOPY, WITH BLADDER CALCULUS LITHOLAPAXY (N/A) as a surgical intervention.  The patient's history has been reviewed, patient examined, no change in status, stable for surgery.  I have reviewed the patient's chart and labs.  Questions were answered to the patient's satisfaction.    RRR CTAB   [name]

## 2023-11-01 NOTE — Transfer of Care (Signed)
 Immediate Anesthesia Transfer of Care Note  Patient: Charles Mccullough  Procedure(s) Performed: ENUCLEATION, PROSTATE, USING LASER, WITH MORCELLATION (Prostate) CYSTOSCOPY, WITH BLADDER CALCULUS LITHOLAPAXY (Bladder)  Patient Location: PACU  Anesthesia Type:General  Level of Consciousness: drowsy and patient cooperative  Airway & Oxygen Therapy: Patient Spontanous Breathing  Post-op Assessment: Report given to RN and Post -op Vital signs reviewed and stable  Post vital signs: stable on room air  Last Vitals:  Vitals Value Taken Time  BP 114/66 11/01/23 1000  Temp    Pulse 72 11/01/23 1001  Resp 17 11/01/23 1001  SpO2 96 % 11/01/23 1001  Vitals shown include unfiled device data.  Last Pain:  Vitals:   11/01/23 0631  TempSrc: Temporal  PainSc: 0-No pain         Complications: No notable events documented.

## 2023-11-01 NOTE — Discharge Instructions (Signed)

## 2023-11-01 NOTE — Anesthesia Preprocedure Evaluation (Signed)
 Anesthesia Evaluation  Patient identified by MRN, date of birth, ID band Patient awake    Reviewed: Allergy & Precautions, NPO status , Patient's Chart, lab work & pertinent test results  History of Anesthesia Complications Negative for: history of anesthetic complications  Airway Mallampati: III  TM Distance: <3 FB Neck ROM: full    Dental  (+) Chipped   Pulmonary neg shortness of breath, sleep apnea    Pulmonary exam normal        Cardiovascular Exercise Tolerance: Good hypertension, (-) angina (-) DOE Normal cardiovascular exam+ dysrhythmias      Neuro/Psych negative neurological ROS  negative psych ROS   GI/Hepatic negative GI ROS, Neg liver ROS,,,  Endo/Other  diabetes, Type 2    Renal/GU Renal disease     Musculoskeletal   Abdominal   Peds  Hematology negative hematology ROS (+)   Anesthesia Other Findings Past Medical History: No date: Aortic atherosclerosis (HCC) No date: Arthritis No date: Bladder stone No date: BPH with urinary obstruction No date: Complication of anesthesia     Comment:  a.) prolonged/delayed emergence No date: Coronary artery disease No date: Diastolic dysfunction     Comment:  a.) TTE 11/10/2019: EF >55%, no RWMAs, G1DD, norm RVSF,               mild MAC, triv panval regurg No date: Diverticulosis No date: History of bilateral cataract extraction No date: HLD (hyperlipidemia) No date: Hypertension 2004: Kidney stones No date: Long-term use of aspirin therapy 2004: Pyelonephritis No date: RBBB (right bundle branch block) No date: Staghorn calculus No date: T2DM (type 2 diabetes mellitus) (HCC)  Past Surgical History: No date: COLONOSCOPY     Comment:  x 3 No date: COLONOSCOPY W/ POLYPECTOMY No date: EXTENSOR TENDON OF FOREARM / WRIST REPAIR; Right No date: EYE SURGERY; Bilateral     Comment:  cataracts with lens replace No date: KIDNEY STONE SURGERY      Reproductive/Obstetrics negative OB ROS                             Anesthesia Physical Anesthesia Plan  ASA: 3  Anesthesia Plan: General ETT   Post-op Pain Management:    Induction: Intravenous  PONV Risk Score and Plan: Ondansetron, Dexamethasone, Midazolam and Treatment may vary due to age or medical condition  Airway Management Planned: Oral ETT  Additional Equipment:   Intra-op Plan:   Post-operative Plan: Extubation in OR  Informed Consent: I have reviewed the patients History and Physical, chart, labs and discussed the procedure including the risks, benefits and alternatives for the proposed anesthesia with the patient or authorized representative who has indicated his/her understanding and acceptance.     Dental Advisory Given  Plan Discussed with: Anesthesiologist, CRNA and Surgeon  Anesthesia Plan Comments: (Patient consented for risks of anesthesia including but not limited to:  - adverse reactions to medications - damage to eyes, teeth, lips or other oral mucosa - nerve damage due to positioning  - sore throat or hoarseness - Damage to heart, brain, nerves, lungs, other parts of body or loss of life  Patient voiced understanding and assent.)       Anesthesia Quick Evaluation

## 2023-11-01 NOTE — Op Note (Signed)
 Date of procedure: 11/01/23  Preoperative diagnosis:  BPH with BOO Bladder stone  Postoperative diagnosis:  same   Procedure: HoLEP with morcellation Cystolitholapaxy, 2.5 cm jack stone  Surgeon: [name], MD  Anesthesia: General  Complications: None  Intraoperative findings: 2.5 cm jack stone.  Mildly trabeculated bladder.  Elevated bladder neck with fused median and right lobe.  EBL: Minimal  Specimens: None  Drains: 20 French two-way Foley catheter with 50 cc in the balloon  Indication: Charles Mccullough is a 68 y.o. patient with gross hematuria found to have bladder stone and significant prostamegaly/BPH.  After reviewing the management options for treatment, he elected to proceed with the above surgical procedure(s). We have discussed the potential benefits and risks of the procedure, side effects of the proposed treatment, the likelihood of the patient achieving the goals of the procedure, and any potential problems that might occur during the procedure or recuperation. Informed consent has been obtained.  Description of procedure:  The patient was taken to the operating room and general anesthesia was induced.  The patient was placed in the dorsal lithotomy position, prepped and draped in the usual sterile fashion, and preoperative antibiotics were administered. A preoperative time-out was performed.    Male sounds were used to calibrate the urethral meatus.  A 26 French resectoscope sheath using a blunt angled obturator was introduced without difficulty into the bladder.  The bladder was carefully inspected and noted to be mildly trabeculated, prostamegaly with elevated bladder neck and fusion of the median lobe with right lateral lobe.    The trigone was able to be visualized with some manipulation and the UOs were a good distance bladder neck itself.    A 500 m laser fiber was then brought in and using settings of 0.8 J's and 30 Hz, the bladder stone was addressed.   This was in the shape of a jack and was relatively hard.  I was able to break off all of the arms of the stone and then break up the center such that eventually, always the chips were able to be evacuated out of the bladder.   Next, I made an incision at the 5 o'clock position between the median lobe and the left lateral lobe.  Level of the bladder neck/capsular fibers.  The incision was carried down caudally meeting in the midline just above the verumontanum.   Next, a semilunar incision was created at the prostatic apex on the left side again freeing up the adenoma from the underlying capsule.  Care was taken to avoid any resection past the verumontanum.  This incision was carried around laterally and cranially towards the bladder neck.  Ultimately, I was able to complete the anterior commissure mucosa and the adenoma into the bladder creating a widely patent prostatic fossa.     Next, the same similar incision was created at the right prostatic apex.  I addressed both the median lobe and the right lateral lobes as 1 piece.  Once this was completed and cleared from the bladder neck, the prostatic fossa was noted to be widely patent.  Hemostasis was achieved using hemostatic fiber settings.  Bilateral UOs were visualized and free of any injury.  Finally, the 20 French resectoscope was exchanged for nephroscope and using the Piranha handpiece morcellator, the bladder was distended in each of the prostate chips were evacuated.  The bladder was irrigated several times. This point time, there were no residual tissue appreciated in the bladder.  Hemostasis was adequate.  10 mg of IV Lasix was administered to help with postoperative diuresis.  A 20 French two-way Foley catheter was then inserted over a catheter guide with 50 cc in the balloon.  The catheter irrigated easily and well.  Patient was then clean and dry, repositioned supine position, reversed from anesthesia, taken to PACU in stable condition.   Plan:  Patient will return to the office in 2 days for voiding trial.      [name], M.D.

## 2023-11-01 NOTE — Anesthesia Procedure Notes (Signed)
 Procedure Name: Intubation Date/Time: 11/01/2023 7:52 AM  Performed by: Maryla Morrow., CRNAPre-anesthesia Checklist: Patient identified, Patient being monitored, Timeout performed, Emergency Drugs available and Suction available Patient Re-evaluated:Patient Re-evaluated prior to induction Oxygen Delivery Method: Circle system utilized Preoxygenation: Pre-oxygenation with 100% oxygen Induction Type: IV induction Ventilation: Mask ventilation without difficulty Laryngoscope Size: McGrath and 4 Grade View: Grade I Tube type: Oral Tube size: 7.5 mm Number of attempts: 1 Airway Equipment and Method: Stylet Placement Confirmation: ETT inserted through vocal cords under direct vision, positive ETCO2 and breath sounds checked- equal and bilateral Secured at: 23 cm Tube secured with: Tape Dental Injury: Teeth and Oropharynx as per pre-operative assessment

## 2023-11-01 NOTE — Anesthesia Postprocedure Evaluation (Signed)
 Anesthesia Post Note  Patient: Charles Mccullough  Procedure(s) Performed: ENUCLEATION, PROSTATE, USING LASER, WITH MORCELLATION (Prostate) CYSTOSCOPY, WITH BLADDER CALCULUS LITHOLAPAXY (Bladder)  Patient location during evaluation: PACU Anesthesia Type: General Level of consciousness: awake and alert Pain management: pain level controlled Vital Signs Assessment: post-procedure vital signs reviewed and stable Respiratory status: spontaneous breathing, nonlabored ventilation, respiratory function stable and patient connected to nasal cannula oxygen Cardiovascular status: blood pressure returned to baseline and stable Postop Assessment: no apparent nausea or vomiting Anesthetic complications: no   There were no known notable events for this encounter.   Last Vitals:  Vitals:   11/01/23 1030 11/01/23 1056  BP: 110/69 120/68  Pulse: 66 69  Resp: 15 16  Temp:  (!) 36.3 C  SpO2: 100% 99%    Last Pain:  Vitals:   11/01/23 1056  TempSrc:   PainSc: 3                  Cleda Mccreedy Allen Basista

## 2023-11-02 ENCOUNTER — Encounter: Payer: Self-pay | Admitting: Urology

## 2023-11-02 LAB — SURGICAL PATHOLOGY

## 2023-11-03 ENCOUNTER — Encounter: Payer: Self-pay | Admitting: Urology

## 2023-11-03 ENCOUNTER — Encounter: Admitting: Physician Assistant

## 2023-11-03 ENCOUNTER — Encounter: Payer: Self-pay | Admitting: Physician Assistant

## 2023-11-03 ENCOUNTER — Ambulatory Visit (INDEPENDENT_AMBULATORY_CARE_PROVIDER_SITE_OTHER): Admitting: Physician Assistant

## 2023-11-03 VITALS — BP 130/82 | HR 74

## 2023-11-03 DIAGNOSIS — N401 Enlarged prostate with lower urinary tract symptoms: Secondary | ICD-10-CM

## 2023-11-03 DIAGNOSIS — N138 Other obstructive and reflux uropathy: Secondary | ICD-10-CM

## 2023-11-03 LAB — BLADDER SCAN AMB NON-IMAGING: Scan Result: 46

## 2023-11-03 NOTE — Progress Notes (Signed)
 Catheter Removal  Patient is present today for a catheter removal.  50ml of water was drained from the balloon. A 20FR foley cath was removed from the bladder, no complications were noted. Patient tolerated well.  Performed by: Carman Ching, PA-C   Afternoon follow-up  Patient returned to clinic this afternoon for PVR. He reports drinking approximately 56oz of fluid. He has been able to urinate several times. He has had urinary leakage. PVR 46mL.  Results for orders placed or performed in visit on 11/03/23  BLADDER SCAN AMB NON-IMAGING   Collection Time: 11/03/23  2:14 PM  Result Value Ref Range   Scan Result 46     Voiding trial passed. Counseled patient on normal postoperative findings including dysuria, gross hematuria, and urinary urgency/leakage. Counseled patient to begin Kegel exercises 3x10 sets daily to increase urinary control and wear absorbent products as needed for security. Written and verbal resources provided today. Surgical pathology benign, patient and wife expressed understanding.   Follow up: Return in about 6 weeks (around 12/15/2023) for Postop f/u with Dr. Apolinar Junes.

## 2023-11-03 NOTE — Patient Instructions (Signed)

## 2023-12-14 ENCOUNTER — Ambulatory Visit (INDEPENDENT_AMBULATORY_CARE_PROVIDER_SITE_OTHER): Admitting: Urology

## 2023-12-14 VITALS — BP 122/78 | HR 92

## 2023-12-14 DIAGNOSIS — N200 Calculus of kidney: Secondary | ICD-10-CM

## 2023-12-14 DIAGNOSIS — Z87898 Personal history of other specified conditions: Secondary | ICD-10-CM

## 2023-12-14 DIAGNOSIS — N210 Calculus in bladder: Secondary | ICD-10-CM

## 2023-12-14 DIAGNOSIS — Z87440 Personal history of urinary (tract) infections: Secondary | ICD-10-CM

## 2023-12-14 DIAGNOSIS — N390 Urinary tract infection, site not specified: Secondary | ICD-10-CM

## 2023-12-14 DIAGNOSIS — N401 Enlarged prostate with lower urinary tract symptoms: Secondary | ICD-10-CM

## 2023-12-14 DIAGNOSIS — N3941 Urge incontinence: Secondary | ICD-10-CM

## 2023-12-14 LAB — BLADDER SCAN AMB NON-IMAGING: Scan Result: 21

## 2023-12-14 NOTE — Progress Notes (Signed)
 I,Amy L Pierron,acting as a scribe for Dustin Gimenez, MD.,have documented all relevant documentation on the behalf of Dustin Gimenez, MD,as directed by  Dustin Gimenez, MD while in the presence of Dustin Gimenez, MD.  12/14/2023 11:51 AM   Charles Mccullough 1956/01/27 [id number]  Referring provider: Yehuda Helms, MD [address] [facility] [place],  [place] [address]  Chief Complaint  Patient presents with   Routine Post Op    HPI: 68 year-old male with a personal history of a bladder jackstone, BPH/ recurrent UTIs, and elevated PSA presents today for follow-up.   He underwent Cystolitholapaxy and HoLep on 11/01/2023. Surgical pathology revealed chronic prostatitis and BPH, with no malignant pathology; 42.8 gram resection.   He has a 2.2 cm left lower pole partial staghorn stone, previously asymptomatic but associated with recurrent infections.   He and his wife are concerned about occasional bleeding episodes.  The last one happened about 2 weeks ago after he worked in the garden with stooping bending and moving around.  He is also past some small tissue at times.  When this happens, he has some mild dysuria.  Otherwise, no burning with urination.  History is excellent.  It is splinting somewhat.  He is dry at nighttime but in the days much of an active, he leaks.  He is having to wear a diaper still notices some mild improvement.  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    IPSS     Row Name 12/14/23 1000         International Prostate Symptom Score   How often have you had the sensation of not emptying your bladder? Less than 1 in 5     How often have you had to urinate less than every two hours? Less than half the time     How often have you found you stopped and started again several times when you urinated? Less than 1 in 5 times     How often have you found it difficult to postpone urination? Less than 1 in 5 times     How often have you had a weak urinary stream? Less  than 1 in 5 times     How often have you had to strain to start urination? Not at All     How many times did you typically get up at night to urinate? 3 Times     Total IPSS Score 9       Quality of Life due to urinary symptoms   If you were to spend the rest of your life with your urinary condition just the way it is now how would you feel about that? Terrible            Score:  1-7 Mild 8-19 Moderate 20-35 Severe Results for orders placed or performed in visit on 12/14/23  BLADDER SCAN AMB NON-IMAGING  Result Value Ref Range   Scan Result 21 ml    PMH: Past Medical History:  Diagnosis Date   Aortic atherosclerosis (HCC)    Arthritis    Bladder stone    BPH with urinary obstruction    Complication of anesthesia    a.) prolonged/delayed emergence   Coronary artery disease    Diastolic dysfunction    a.) TTE 11/10/2019: EF >55%, no RWMAs, G1DD, norm RVSF, mild MAC, triv panval regurg   Diverticulosis    History of bilateral cataract extraction    HLD (hyperlipidemia)    Hypertension  Kidney stones 2004   Long-term use of aspirin therapy    Pyelonephritis 2004   RBBB (right bundle branch block)    Staghorn calculus    T2DM (type 2 diabetes mellitus) (HCC)     Surgical History: Past Surgical History:  Procedure Laterality Date   COLONOSCOPY     x 3   COLONOSCOPY W/ POLYPECTOMY     CYSTOSCOPY WITH LITHOLAPAXY N/A 11/01/2023   Procedure: CYSTOSCOPY, WITH BLADDER CALCULUS LITHOLAPAXY;  Surgeon: Dustin Gimenez, MD;  Location: [facility] ORS;  Service: Urology;  Laterality: N/A;   EXTENSOR TENDON OF FOREARM / WRIST REPAIR Right    EYE SURGERY Bilateral    cataracts with lens replace   HOLEP-LASER ENUCLEATION OF THE PROSTATE WITH MORCELLATION N/A 11/01/2023   Procedure: ENUCLEATION, PROSTATE, USING LASER, WITH MORCELLATION;  Surgeon: Dustin Gimenez, MD;  Location: [facility] ORS;  Service: Urology;  Laterality: N/A;   KIDNEY STONE SURGERY      Home Medications:  Allergies  as of 12/14/2023   No Active Allergies      Medication List        Accurate as of December 14, 2023 11:51 AM. If you have any questions, ask your nurse or doctor.          STOP taking these medications    HYDROcodone -acetaminophen  5-325 MG tablet Commonly known as: NORCO/VICODIN   oxybutynin  5 MG tablet Commonly known as: DITROPAN        TAKE these medications    aspirin EC 81 MG tablet Take 81 mg by mouth at bedtime.   atorvastatin 10 MG tablet Commonly known as: LIPITOR Take 10 mg by mouth in the morning.   cyanocobalamin 1000 MCG tablet Commonly known as: VITAMIN B12 Take 1,000 mcg by mouth every evening.   glimepiride 4 MG tablet Commonly known as: AMARYL Take 4 mg by mouth daily with breakfast.   losartan 100 MG tablet Commonly known as: COZAAR Take 100 mg by mouth in the morning.   metFORMIN 1000 MG tablet Commonly known as: GLUCOPHAGE Take 1,000 mg by mouth at bedtime.   OneTouch Delica Plus Lancet33G Misc SMARTSIG:1 Topical Daily   OneTouch Ultra Test test strip Generic drug: glucose blood 1 each 3 (three) times daily.       Social History:  reports that he has never smoked. He has never used smokeless tobacco. He reports that he does not drink alcohol and does not use drugs.   Physical Exam: BP 122/78   Pulse 92   Constitutional:  Alert and oriented, No acute distress. HEENT: [place] AT, moist mucus membranes.  Trachea midline, no masses. Neurologic: Grossly intact, no focal deficits, moving all 4 extremities. Psychiatric: Normal mood and affect.  Pertinent Imaging: CT HEMATURIA WORKUP  Narrative CLINICAL DATA:  Microscopic hematuria, recurrent UTIs  EXAM: CT ABDOMEN AND PELVIS WITHOUT AND WITH CONTRAST  TECHNIQUE: Multidetector CT imaging of the abdomen and pelvis was performed following the standard protocol before and following the bolus administration of intravenous contrast.  RADIATION DOSE REDUCTION: This exam was performed  according to the departmental dose-optimization program which includes automated exposure control, adjustment of the mA and/or kV according to patient size and/or use of iterative reconstruction technique.  CONTRAST:  OMNIPAQUE  IOHEXOL  300 MG/ML  SOLN  COMPARISON:  None Available.  FINDINGS: Lower chest: Lung bases are clear.  Hepatobiliary: 10 mm inferior right hepatic lobe cyst (series 7/image 33), benign.  Gallbladder is unremarkable. No intrahepatic or extrahepatic ductal dilatation.  Pancreas: Within normal limits.  Spleen: Within normal limits.  Adrenals/Urinary Tract: Adrenal glands are within normal limits.  Kidneys are within normal limits.  2.2 cm partial staghorn left lower pole renal calculus (sagittal image 100). Additional 2 mm nonobstructing interpolar left renal calculus (series 2/image 40). No ureteral calculi. No hydronephrosis.  On delayed imaging, there are no filling defects in the bilateral opacified proximal collecting systems, ureters, or bladder.  2.2 cm stellate calculus in the bladder (series 2/image 88). Thick-walled bladder, although underdistended, favoring chronic bladder obstruction given additional findings.  Stomach/Bowel: Stomach is within normal limits.  No evidence of bowel obstruction.  Normal appendix (series 7/image 59).  Mild left colonic diverticulosis, without evidence of diverticulitis.  Vascular/Lymphatic: No evidence of abdominal aortic aneurysm.  Atherosclerotic calcifications of the abdominal aorta and branch vessels, although vessels remain patent.  No suspicious abdominopelvic lymphadenopathy.  Reproductive: Prostatomegaly, with enlargement of the central gland indenting the base of the bladder, suggesting BPH.  Other: No abdominopelvic ascites.  Musculoskeletal: Degenerative changes of the visualized thoracolumbar spine.  IMPRESSION: 2.2 cm partial staghorn left lower pole renal calculus.  Additional 2 mm nonobstructing interpolar left renal calculus. No ureteral calculi or hydronephrosis.  Additional 2.2 cm bladder calculus.  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  Electronically Signed By: Zadie Herter M.D. On: 08/26/2023 02:25 Personally reviewed the above images and agree with radiologic interpretation.   Assessment & Plan:    1. Benign Prostatic Hyperplasia (BPH) with history of bladder stones - Status post HoLep. Surgical pathology revealed chronic prostatitis and BPH, with no malignant pathology. PVR is 21, indicating good bladder emptying. Continue monitoring symptoms and manage conservatively.   2. Stress urinary incontinence -Gave information regarding penile clamps. - Discussed importance of Kegel exercises and deep core exercises to improve pelvic floor strength/sphincter health. - Offered referral to PT, declined  3. History of elevated PSA - Likely due to an infection prior to surgery. Plan to recheck PSA in six months to establish a new baseline, which should be lower than the previous baseline.  4. Left lower pole partial staghorn kidney stone (2.2 cm) - Currently asymptomatic. Discussed addressing the kidney stone, noting that it could be a nidus for infections if colonized by bacteria. Options include monitoring with an x-ray in six months or considering ureteroscopy if symptoms develop. Ureteroscopy is favored over PCNL due to lower risk of complications but will ultimately be at the discretion of Dr. Estanislao Heimlich with whom he will follow-up.  He thinks 6 months is good timeframe for intervention for the stone.  5. Recurrent Urinary Tract Infections (UTIs) - Likely related to previous bladder stones and incomplete bladder emptying. Monitor for recurrence, especially in relation to the kidney stone.  Return in about 6 months (around 06/14/2024) for PSA, IPSS, PVR with Dr. Estanislao Heimlich.  I have reviewed the above documentation  for accuracy and completeness, and I agree with the above.   Dustin Gimenez, MD    [facility] [address], [address] [place], [place] [address] [phone]

## 2023-12-14 NOTE — Patient Instructions (Signed)
    [email or web address]  The Wiesner Incontinence Clamp is an external medical device used to control urine leakage by compressing the urethra and preventing the flow of urine   Lets you maintain your active lifestyle Cost effective, saving you lots of money on adult incontinence briefs Can be worn during any activity Ergonomic design promotes confidence and provides all-day comfort  Step 1: Open the incontinence clamp by releasing the catch and lifting up the top part.   Step 2: Place your penis between the silicone rubber pads with the incontinence clamp about halfway down the shaft of your penis.   Step 3: Latch the incontinence clamp to compress the urethra at the level that's comfortable to you.

## 2024-03-20 ENCOUNTER — Encounter: Payer: Self-pay | Admitting: Urology

## 2024-05-01 ENCOUNTER — Other Ambulatory Visit: Payer: Self-pay

## 2024-05-01 DIAGNOSIS — Z87898 Personal history of other specified conditions: Secondary | ICD-10-CM

## 2024-05-29 ENCOUNTER — Ambulatory Visit: Attending: Internal Medicine | Admitting: Physical Therapy

## 2024-05-29 ENCOUNTER — Encounter: Payer: Self-pay | Admitting: Physical Therapy

## 2024-05-29 DIAGNOSIS — R202 Paresthesia of skin: Secondary | ICD-10-CM | POA: Insufficient documentation

## 2024-05-29 DIAGNOSIS — R2689 Other abnormalities of gait and mobility: Secondary | ICD-10-CM | POA: Insufficient documentation

## 2024-05-29 DIAGNOSIS — M533 Sacrococcygeal disorders, not elsewhere classified: Secondary | ICD-10-CM | POA: Insufficient documentation

## 2024-05-29 DIAGNOSIS — R200 Anesthesia of skin: Secondary | ICD-10-CM | POA: Diagnosis present

## 2024-05-29 NOTE — Therapy (Addendum)
 OUTPATIENT PHYSICAL THERAPY EVALUATION   Patient Name: Charles Mccullough MRN: [id number] DOB:August 18, 1955, 68 y.o., male Today's Date: 05/29/2024   PT End of Session - 05/29/24 0938     Visit Number 1    Number of Visits 10    Date for Recertification  08/07/24    PT Start Time 9062    PT Stop Time 1015    PT Time Calculation (min) 38 min          Past Medical History:  Diagnosis Date   Aortic atherosclerosis    Arthritis    Bladder stone    BPH with urinary obstruction    Complication of anesthesia    a.) prolonged/delayed emergence   Coronary artery disease    Diastolic dysfunction    a.) TTE 11/10/2019: EF >55%, no RWMAs, G1DD, norm RVSF, mild MAC, triv panval regurg   Diverticulosis    History of bilateral cataract extraction    HLD (hyperlipidemia)    Hypertension    Kidney stones 2004   Long-term use of aspirin therapy    Pyelonephritis 2004   RBBB (right bundle branch block)    Staghorn calculus    T2DM (type 2 diabetes mellitus) (HCC)    Past Surgical History:  Procedure Laterality Date   COLONOSCOPY     x 3   COLONOSCOPY W/ POLYPECTOMY     CYSTOSCOPY WITH LITHOLAPAXY N/A 11/01/2023   Procedure: CYSTOSCOPY, WITH BLADDER CALCULUS LITHOLAPAXY;  Surgeon: Penne Knee, MD;  Location: [facility] OR[facility];  Service: Urology;  Laterality: N/A;   EXTENSOR TENDON OF FOREARM / WRIST REPAIR Right    EYE SURGERY Bilateral    cataracts with lens replace   HOLEP-LASER ENUCLEATION OF THE PROSTATE WITH MORCELLATION N/A 11/01/2023   Procedure: ENUCLEATION, PROSTATE, USING LASER, WITH MORCELLATION;  Surgeon: Penne Knee, MD;  Location: [facility];  Service: Urology;  Laterality: N/A;   KIDNEY STONE SURGERY     There are no active problems to display for this patient.   ERE:Dejmxd   REFERRING PROVIDER: [facility]   REFERRING DIAG: R32 (ICD-10-CM) - Urinary incontinence, unspecified type  Rationale for Evaluation and Treatment Rehabilitation  THERAPY DIAG:  Sacrococcygeal  disorders, not elsewhere classified  Other abnormalities of gait and mobility  Numbness and tingling of both feet  ONSET DATE:   SUBJECTIVE:                                                                                                                                                                                           SUBJECTIVE STATEMENT ON EVAL 05/29/24: 1)  Urinary leakage after HOLEP procedure for BPH  March 2025  :  changes Depends diapers 3 x a day . No lekaage at night . It seems worse.  Urine stream improved post surgery.  Pt notices heaviness filling up  of diaper. Pt does not feel the leakage.  Pt does feel the urge urinate but it is beyond the time the time to go, with leakaging before making it to the bathroom .   Leakage also occurs with walking .  2) numbness in B feet:  occurs with walking, walking 3 mi daily without stretching routine. Numbness eases off after 1 mile.  Somedays with  numbness and other days numbness is ok.   PERTINENT HISTORY:    PAIN:  Are you having pain? No  PRECAUTIONS: None  WEIGHT BEARING RESTRICTIONS: No  FALLS:  Has patient fallen in last 6 months? No  LIVING ENVIRONMENT: Lives with: wife  Lives in: two story  Stairs: STE 4 with rail,  steps to above garage apartment with rail    OCCUPATION: retired for the past 9 years,  active caring for grandchildren, walking 3 mi daily without stretching routine   PLOF: IND   PATIENT GOALS:   Not to wear depends   OBJECTIVE:    [facility] PT Assessment - 05/29/24 0947       Observation/Other Assessments   Observations slumped sitting,  thoracic kyphosis      Sit to Stand   Comments ankles crossed to rise out of chair, WBing on heels      Palpation   SI assessment  R shoulder, R iliac crest lowered      Ambulation/Gait   Gait Comments 0.97 m/s ,  decreased stance on R, L adducted knees , asymmetries to spine    Sensory test on B feet: plan to assess          Self-Care    Self-Care Other Self-Care Comments    Other Self-Care Comments  explained regional interdependent approach to address pain at different body parts, explained upcoming visits to realign posture and spine.pelvis to improve Sx and achieve goals, showed anatomy images of deep core system ,      Therapeutic Activites    Therapeutic Activities Other Therapeutic Activities    Other Therapeutic Activities inquired about meaningful tasks and role of learning proper body mechanics, future sessions to help realign pelvis and spine and to learn co-activation of deep core system when performing against gravity tasks . These improvements will help address pain and Sx     Neuro Re-ed    Neuro Re-ed Details  cued for alignment and proprioception of pelvis and LKC and spine in  sit to stand and for proper sitting posture to activate deep core system             HOME EXERCISE PROGRAM: See pt instruction section    ASSESSMENT:  CLINICAL IMPRESSION: Pt is a  68  yo  who presents with  following issues which impact QOL, ADL,  fitness, social and community activities:    1)  Urinary leakage after HOLEP procedure for BPH  March 2025     2) numbness in B feet  Pt's musculoskeletal assessment revealed uneven pelvic girdle and shoulder height, asymmetries to gait pattern, limited spinal /pelvic mobility, dyscoordination and strength of pelvic floor mm, hip weakness, poor body mechanics which places strain on the abdominal/pelvic floor mm. These are deficits that indicate an ineffective intraabdominal pressure system associated with increased risk for pt's Sx.      Pt will benefit from propioception/ coordination/  body mechanics training and education with gravity-loaded tasks at work and home and  fitness modifications in order to gain a more effective intraabdominal pressure system to minimize Sx. Advised pt to not perform sit-ups and crunches as these movement patterns lead to more downward forces on the  pelvic floor, negatively impacting abdominopelvic/spinal dysfunctions.   Pt was provided education on etiology of Sx with anatomy, physiology explanation with images along with the benefits of customized pelvic PT Tx based on pt's medical conditions and musculoskeletal deficits.  Explained the physiology of deep core mm coordination and roles of pelvic floor function in urination, defecation, sexual function, and postural control with deep core mm system, and the role of posture and alignment to help pelvic issues.    Regional interdependent approaches will yield greater benefits in pt's POC due to the complexity of pt's medical Hx and the significant impact their Sx have had on their QOL. Pt would benefit from a biopsychosocial approach to yield optimal outcomes. Plan to build interdisciplinary team with pt's providers to optimize patient-centered care as needed.   Following Tx today which pt tolerated without complaints,  pt demo'd proper body mechanics to minimize straining pelvic floor.  Plan to perform sensory test on B feet next session before treating misalignment of spine and pelvis.  Plan to address realignment of spine/ pelvis at next session to help promote optimize IAP system for improved pelvic floor function, trunk stability, gait, balance, stabilization with mobility tasks.  Plan to address pelvic floor issues once pelvis and spine are realigned to yield better outcomes.                                                       Pt benefits from skilled PT.    OBJECTIVE IMPAIRMENTS decreased activity tolerance, decreased coordination, decreased endurance, decreased mobility, difficulty walking, decreased ROM, decreased strength, decreased safety awareness, hypomobility, increased muscle spasms, impaired flexibility, improper body mechanics, postural dysfunction, and pain. scar restrictions   ACTIVITY LIMITATIONS  self-care,  home chores, work tasks    PARTICIPATION LIMITATIONS:   community, walking activities    PERSONAL FACTORS   affecting patient's functional outcome:    REHAB POTENTIAL: Good   CLINICAL DECISION MAKING: Evolving/moderate complexity   EVALUATION COMPLEXITY: Moderate    PATIENT EDUCATION:    Education details: Showed pt anatomy images. Explained muscles attachments/ connection, physiology of deep core system/ spinal- thoracic-pelvis-lower kinetic chain as they relate to pt's presentation, Sx, and past Hx. Explained what and how these areas of deficits need to be restored to balance and function    See Therapeutic activity / neuromuscular re-education section  Answered pt's questions.   Person educated: Patient Education method: Explanation, Demonstration, Tactile cues, Verbal cues, and Handouts Education comprehension: verbalized understanding, returned demonstration, verbal cues required, tactile cues required, and needs further education     PLAN: PT FREQUENCY: 1x/week   PT DURATION: 10 weeks   PLANNED INTERVENTIONS:   Gait training;Stair training;Functional mobility training;DME Instruction;Therapeutic activities;Therapeutic exercise;Balance training;Neuromuscular re-education;Patient/family education;Vestibular;Visual/perceptual remediation/compensation;Passive range of motion;Moist Heat;Cryotherapy;Traction;Canalith Repostioning;Joint Manipulations;Manual lymph drainage;Manual techniques;Scar mobilization;Energy conservation;Dry needling;ADLs/Self Care Home Management;Biofeedback;Electrical Stimulation;Taping    PLAN FOR NEXT SESSION: See clinical impression for plan     GOALS: Goals reviewed with patient? Yes  SHORT TERM GOALS: Target date: 06/26/2024    Pt will demo IND with  HEP                    Baseline: Not IND            Goal status: INITIAL   LONG TERM GOALS: Target date: 08/07/2024    1.Pt will demo proper deep core coordination without chest breathing and optimal excursion of diaphragm/pelvic floor in order  to promote spinal stability and pelvic floor function  Baseline: dyscoordination Goal status: INITIAL  2.  Pt will demo proper body mechanics in against gravity tasks and ADLs  work tasks, fitness  to minimize straining pelvic floor / back    Baseline: not IND, improper form that places strain on pelvic floor  Goal status: INITIAL    3. Pt will demo increased gait speed > 1.3 m/s with reciprocal gait pattern, longer stride length  in order to ambulate safely in community and return to fitness routine  Baseline: 0.97 m/s , decreased stance on R, L adducted knees , asymmetries to spine , limited hip /knee flexion Goal status: INITIAL    4. Pt will demo levelled pelvic girdle and shoulder height in order to progress to deep core strengthening HEP and restore mobility at spine, pelvis, gait, posture minimize falls, and improve balance  Baseline: R shoulder, R iliac crest lowered  Goal status: INITIAL   5. Pt will improve IPPS questionnaire to  pts  score change  to demo improved QOL  Baseline:  ( greater pts indicate greater negative impact on QOL)  10   pts  ( total)  moderate range     5  pts unhappy ( QOL score)   Baseline:  Goal status: INITIAL  6. Pt will be IND with stretching routine post walking for optimal flexibility and continence. Pt will also report <50% leakage with walking . Pt will report B feet numbness occurring less in intensity or at > 2 miles   Baseline: :leakage occurs with walking, B feet numbness occur at 1 mile point with walking routine  GOAL STATUS: initial    7. Pt will report gained sensation of leakage > 1 x day and report decreased diaper wear from 3 x a day to < 2 x diapers   Baseline: only notices when the diaper is full , once a day, pt senses leakage  Goal Status: Initial   8. Sensory test to be performed for numbness  Baseline: to be assessed Goal Status: Initial    Pia Lupe Plump, PT 05/29/2024, 9:57 AM

## 2024-05-29 NOTE — Patient Instructions (Signed)

## 2024-06-06 ENCOUNTER — Ambulatory Visit: Admitting: Physical Therapy

## 2024-06-06 DIAGNOSIS — R2689 Other abnormalities of gait and mobility: Secondary | ICD-10-CM

## 2024-06-06 DIAGNOSIS — M533 Sacrococcygeal disorders, not elsewhere classified: Secondary | ICD-10-CM | POA: Diagnosis not present

## 2024-06-06 DIAGNOSIS — R200 Anesthesia of skin: Secondary | ICD-10-CM

## 2024-06-06 NOTE — Patient Instructions (Signed)
  ZigZag stretch  Reclined twist for hips and side of the hips/ legs  Lay on your back, knees bend, dig elbows and feet into bed to exhale and lift buttocks up to  Scoot hips and feet  to the L , leave shoulders in place Wobble knees to the R side 45 deg and to midline  10 reps   Then reach for R thigh with R hand cross body, straighten knee and point toes toward face, bend knee and repeat straigthen 10 reps to lengthen hamstring and ITband (side of hip)   Repeat on other side: Scoot hip and feet  R ,  leave shoulders in place Wobble knees to the L side 45 deg and to midline  10 reps   Then reach for L thigh with R hand cross body, straighten knee and point toes toward face, bend knee and repeat straigthen 10 reps to lengthen hamstring and ITband (side of hip)   __

## 2024-06-06 NOTE — Therapy (Signed)
 OUTPATIENT PHYSICAL THERAPY TREATMENT   Patient Name: Charles Mccullough MRN: [id number] DOB:05/31/1956, 68 y.o., male Today's Date: 06/06/2024   PT End of Session - 06/06/24 0909     Visit Number 2    Number of Visits 10    Date for Recertification  08/07/24    PT Start Time 0850    PT Stop Time 0934    PT Time Calculation (min) 44 min    Activity Tolerance Patient tolerated treatment well;No increased pain    Behavior During Therapy [facility] for tasks assessed/performed          Past Medical History:  Diagnosis Date   Aortic atherosclerosis    Arthritis    Bladder stone    BPH with urinary obstruction    Complication of anesthesia    a.) prolonged/delayed emergence   Coronary artery disease    Diastolic dysfunction    a.) TTE 11/10/2019: EF >55%, no RWMAs, G1DD, norm RVSF, mild MAC, triv panval regurg   Diverticulosis    History of bilateral cataract extraction    HLD (hyperlipidemia)    Hypertension    Kidney stones 2004   Long-term use of aspirin therapy    Pyelonephritis 2004   RBBB (right bundle branch block)    Staghorn calculus    T2DM (type 2 diabetes mellitus) (HCC)    Past Surgical History:  Procedure Laterality Date   COLONOSCOPY     x 3   COLONOSCOPY W/ POLYPECTOMY     CYSTOSCOPY WITH LITHOLAPAXY N/A 11/01/2023   Procedure: CYSTOSCOPY, WITH BLADDER CALCULUS LITHOLAPAXY;  Surgeon: Penne Knee, MD;  Location: [facility];  Service: Urology;  Laterality: N/A;   EXTENSOR TENDON OF FOREARM / WRIST REPAIR Right    EYE SURGERY Bilateral    cataracts with lens replace   HOLEP-LASER ENUCLEATION OF THE PROSTATE WITH MORCELLATION N/A 11/01/2023   Procedure: ENUCLEATION, PROSTATE, USING LASER, WITH MORCELLATION;  Surgeon: Penne Knee, MD;  Location: [facility];  Service: Urology;  Laterality: N/A;   KIDNEY STONE SURGERY     There are no active problems to display for this patient.   ERE:Dejmxd   REFERRING PROVIDER: Sparks   REFERRING DIAG: R32 (ICD-10-CM) -  Urinary incontinence, unspecified type  Rationale for Evaluation and Treatment Rehabilitation  THERAPY DIAG:  Sacrococcygeal disorders, not elsewhere classified  Other abnormalities of gait and mobility  Numbness and tingling of both feet  ONSET DATE:   SUBJECTIVE:   SUBJECTIVE STATEMENT TODAY:  Pt has been practicing not crossing his legs and ankles. Also practiced sit to stand technique  SUBJECTIVE STATEMENT ON EVAL 05/29/24: 1)  Urinary leakage after HOLEP procedure for BPH  March 2025  :  changes Depends diapers 3 x a day . No lekaage at night . It seems worse.  Urine stream improved post surgery.  Pt notices heaviness filling up  of diaper. Pt does not feel the leakage.  Pt does feel the urge urinate but it is beyond the time the time to go, with leakaging before making it to the bathroom .   Leakage also occurs with walking .  2) numbness in B feet:  occurs with walking, walking 3 mi daily without stretching routine. Numbness eases off after 1 mile.  Somedays with  numbness and other days numbness is ok.   PERTINENT HISTORY:    PAIN:  Are you having pain? No  PRECAUTIONS: None  WEIGHT BEARING RESTRICTIONS: No  FALLS:  Has patient fallen in last 6 months? No  LIVING ENVIRONMENT: Lives with: wife  Lives in: two story  Stairs: STE 4 with rail,  steps to above garage apartment with rail    OCCUPATION: retired for the past 9 years,  active caring for grandchildren, walking 3 mi daily without stretching routine   PLOF: IND   PATIENT GOALS:   Not to wear depends   OBJECTIVE:   [facility] PT Assessment - 06/06/24 0909       Observation/Other Assessments   Observations L genu valgus  ,   Supine: ASIS/ base of patella,medial malleolus  levelled,      Coordination   Coordination and  Movement Description poor propioception with segmental movement, thoracic extension HEP difficulty  thus, deferred to next session      AROM   Overall AROM Comments L hip flex ( with ER due to pt's presentation),  90 deg ( post Tx: 75 deg )  , R 75 deg      Palpation   SI assessment  R shoulder, R iliac crest lowered    Palpation comment tightness along SIJ B, paraspinals/lumbar      Bed Mobility   Bed Mobility --   difficulty with rolling and scooting without lifting head and due to limited thoracic mobility     Ambulation/Gait   Gait Comments R hip hike, limited hip ext on B ( L > R),          [facility] Adult PT Treatment/Exercise - 06/06/24 0909       Self-Care   Self-Care Other Self-Care Comments    Other Self-Care Comments  excessive cues for scooting and logrolling in bed to minimize straining pelvic floor, explained and showed anatomy image of mm that are tight and being addressed to restore stacked posture      Neuro Re-ed    Neuro Re-ed Details  cued for propioception for stretches to realign spine, propioception for segmental movement with logroling and scooting in bed with decreased straining of pelvic floor      Manual Therapy   Manual therapy comments distraction at B hip,  STM/MWM at SIJ B to improve hip ext / flex mobility, lumbar mobility            HOME EXERCISE PROGRAM: See pt instruction section    ASSESSMENT:  CLINICAL IMPRESSION:     Pt required manual Tx to promote improved stacked posture, alignment of spine and pelvic and improved SIJ mobility. Pt demo'd levelled pelvis alignment but R shoulder remained lowered. Plan to continue with manual Tx to further address spinal deviations at next session.  Poor propioception with segmental movement, thoracic extension HEP difficulty  thus, deferred to next session   Following Tx today which pt tolerated without complaints,  pt demo'd proper body mechanics with logrolling and scooting in bed  with less strain of pelvic floor .  Provided excessive cues for scooting and logrolling in bed to minimize straining pelvic floor, explained and showed anatomy image of mm that are tight and being addressed to restore stacked posture. Cued propioception and technique for stretch to restore L/S mobility.    Anticipate stacked posture and alignment of spine and pelvic and improved SIJ mobility will  help promote optimize IAP system for improved pelvic floor function, trunk stability, gait, balance, stabilization with mobility tasks.  Plan to address pelvic floor issues once pelvis and spine are realigned to yield better outcomes.        Regional interdependent approaches will yield greater benefits in pt's POC       Plan to perform sensory test on B feet next session before treating misalignment of spine and pelvis.  Plan to continue with restoring stacked spine posture and improving lumbosacral rhythm/ mobility to promote continence.                                                      Pt benefits from skilled PT.    OBJECTIVE IMPAIRMENTS decreased activity tolerance, decreased coordination, decreased endurance, decreased mobility, difficulty walking, decreased ROM, decreased strength, decreased safety awareness, hypomobility, increased muscle spasms, impaired flexibility, improper body mechanics, postural dysfunction, and pain. scar restrictions   ACTIVITY LIMITATIONS  self-care,  home chores, work tasks    PARTICIPATION LIMITATIONS:  community, walking activities    PERSONAL FACTORS   affecting patient's functional outcome:    REHAB POTENTIAL: Good   CLINICAL DECISION MAKING: Evolving/moderate complexity   EVALUATION COMPLEXITY: Moderate    PATIENT EDUCATION:    Education details: Showed pt anatomy images. Explained muscles attachments/ connection, physiology of deep core system/ spinal- thoracic-pelvis-lower kinetic chain as they relate to pt's presentation, Sx, and past Hx. Explained  what and how these areas of deficits need to be restored to balance and function    See Therapeutic activity / neuromuscular re-education section  Answered pt's questions.   Person educated: Patient Education method: Explanation, Demonstration, Tactile cues, Verbal cues, and Handouts Education comprehension: verbalized understanding, returned demonstration, verbal cues required, tactile cues required, and needs further education     PLAN: PT FREQUENCY: 1x/week   PT DURATION: 10 weeks   PLANNED INTERVENTIONS:   Gait training;Stair training;Functional mobility training;DME Instruction;Therapeutic activities;Therapeutic exercise;Balance training;Neuromuscular re-education;Patient/family education;Vestibular;Visual/perceptual remediation/compensation;Passive range of motion;Moist Heat;Cryotherapy;Traction;Canalith Repostioning;Joint Manipulations;Manual lymph drainage;Manual techniques;Scar mobilization;Energy conservation;Dry needling;ADLs/Self Care Home Management;Biofeedback;Electrical Stimulation;Taping    PLAN FOR NEXT SESSION: See clinical impression for plan     GOALS: Goals reviewed with patient? Yes  SHORT TERM GOALS: Target date: 06/26/2024    Pt will demo IND with HEP                    Baseline: Not IND            Goal status: INITIAL   LONG TERM GOALS: Target date: 08/07/2024    1.Pt will demo proper deep core coordination without chest breathing and optimal excursion of diaphragm/pelvic floor in order to promote spinal stability and pelvic floor function  Baseline: dyscoordination Goal status: INITIAL  2.  Pt will demo proper body mechanics in against gravity tasks and ADLs  work tasks, fitness  to minimize straining pelvic floor / back    Baseline: not IND, improper form that places strain on pelvic floor  Goal status: INITIAL    3. Pt will demo increased gait speed > 1.3 m/s with reciprocal gait pattern, longer stride length  in order to ambulate safely  in community and return to fitness routine  Baseline: 0.97 m/s , decreased stance on R, L adducted knees , asymmetries to spine , limited hip /knee flexion Goal status: INITIAL    4. Pt will demo levelled pelvic girdle and shoulder height in order to progress to deep core strengthening HEP and restore mobility at spine, pelvis, gait, posture minimize falls, and improve balance  Baseline: R shoulder, R iliac crest lowered  Goal status: INITIAL   5. Pt will improve IPPS questionnaire to  pts  score change  to demo improved QOL  Baseline:  ( greater pts indicate greater negative impact on QOL)  10   pts  ( total)  moderate range     5  pts unhappy ( QOL score)   Baseline:  Goal status: INITIAL  6. Pt will be IND with stretching routine post walking for optimal flexibility and continence. Pt will also report <50% leakage with walking . Pt will report B feet numbness occurring less in intensity or at > 2 miles   Baseline: :leakage occurs with walking, B feet numbness occur at 1 mile point with walking routine  GOAL STATUS: initial    7. Pt will report gained sensation of leakage > 1 x day and report decreased diaper wear from 3 x a day to < 2 x diapers   Baseline: only notices when the diaper is full , once a day, pt senses leakage  Goal Status: Initial   8. Sensory test to be performed for numbness  Baseline: to be assessed Goal Status: Initial    Pia Lupe Plump, PT 06/06/2024, 3:31 PM

## 2024-06-08 ENCOUNTER — Other Ambulatory Visit

## 2024-06-08 DIAGNOSIS — Z87898 Personal history of other specified conditions: Secondary | ICD-10-CM

## 2024-06-09 LAB — PSA: Prostate Specific Ag, Serum: 0.9 ng/mL (ref 0.0–4.0)

## 2024-06-13 ENCOUNTER — Other Ambulatory Visit: Payer: Self-pay

## 2024-06-13 ENCOUNTER — Ambulatory Visit: Admitting: Physical Therapy

## 2024-06-13 DIAGNOSIS — R200 Anesthesia of skin: Secondary | ICD-10-CM

## 2024-06-13 DIAGNOSIS — R2689 Other abnormalities of gait and mobility: Secondary | ICD-10-CM

## 2024-06-13 DIAGNOSIS — M533 Sacrococcygeal disorders, not elsewhere classified: Secondary | ICD-10-CM

## 2024-06-13 DIAGNOSIS — N200 Calculus of kidney: Secondary | ICD-10-CM

## 2024-06-13 NOTE — Patient Instructions (Signed)
 Multifidis twist   Band is on doorknob: sit facing perpendicular to door , sit halfway towards front of chair, firm through 4 points of contact at buttocks and feet. Feet are placed hip with apart.   Twisting trunk without moving the hips and knees Hold band at the level of ribcage, elbows bent,shoulder blades roll back and down like squeezing a pencil under armpit   Exhale twist,.10-15 deg away from door without moving your hips/ knees, press more weight on the side of the sitting bones/ foot opp of your direction of turn as your counterweight. Continue to maintain equal weight through legs.  Keep knee unlocked.  30 reps    __  Walking with arm swings and higher knees,   __   SKI against chair   In front of chair, knee against seat to line knee above ankle,  back foot hip width apart, push off through ballmounds,  opp arm up, thumbs up, chin tuck, shoulders down  Push off through ballmounds , step back to under hip width   3 min

## 2024-06-13 NOTE — Therapy (Signed)
 OUTPATIENT PHYSICAL THERAPY TREATMENT   Patient Name: Charles Mccullough MRN: [id number] DOB:1956/01/30, 68 y.o., male Today's Date: 06/13/2024   PT End of Session - 06/13/24 0914     Visit Number 3    Number of Visits 10    Date for Recertification  08/07/24    PT Start Time 0850    PT Stop Time 0935    PT Time Calculation (min) 45 min    Activity Tolerance Patient tolerated treatment well;No increased pain    Behavior During Therapy [facility] for tasks assessed/performed          Past Medical History:  Diagnosis Date   Aortic atherosclerosis    Arthritis    Bladder stone    BPH with urinary obstruction    Complication of anesthesia    a.) prolonged/delayed emergence   Coronary artery disease    Diastolic dysfunction    a.) TTE 11/10/2019: EF >55%, no RWMAs, G1DD, norm RVSF, mild MAC, triv panval regurg   Diverticulosis    History of bilateral cataract extraction    HLD (hyperlipidemia)    Hypertension    Kidney stones 2004   Long-term use of aspirin therapy    Pyelonephritis 2004   RBBB (right bundle branch block)    Staghorn calculus    T2DM (type 2 diabetes mellitus) (HCC)    Past Surgical History:  Procedure Laterality Date   COLONOSCOPY     x 3   COLONOSCOPY W/ POLYPECTOMY     CYSTOSCOPY WITH LITHOLAPAXY N/A 11/01/2023   Procedure: CYSTOSCOPY, WITH BLADDER CALCULUS LITHOLAPAXY;  Surgeon: Penne Knee, MD;  Location: [facility];  Service: Urology;  Laterality: N/A;   EXTENSOR TENDON OF FOREARM / WRIST REPAIR Right    EYE SURGERY Bilateral    cataracts with lens replace   HOLEP-LASER ENUCLEATION OF THE PROSTATE WITH MORCELLATION N/A 11/01/2023   Procedure: ENUCLEATION, PROSTATE, USING LASER, WITH MORCELLATION;  Surgeon: Penne Knee, MD;  Location: [facility];  Service: Urology;  Laterality: N/A;   KIDNEY STONE SURGERY     There are no active problems to display for this patient.   ERE:Dejmxd   REFERRING PROVIDER: Sparks   REFERRING DIAG: R32 (ICD-10-CM) -  Urinary incontinence, unspecified type  Rationale for Evaluation and Treatment Rehabilitation  THERAPY DIAG:  Sacrococcygeal disorders, not elsewhere classified  Other abnormalities of gait and mobility  Numbness and tingling of both feet  ONSET DATE:   SUBJECTIVE:   SUBJECTIVE STATEMENT TODAY:  Pt notices leakage is better, but it is still a problem with upright activities  SUBJECTIVE STATEMENT ON EVAL 05/29/24: 1)  Urinary leakage after HOLEP procedure for BPH  March 2025  :  changes Depends diapers 3 x a day . No lekaage at night . It seems worse.  Urine stream improved post surgery.  Pt notices heaviness filling up  of diaper. Pt does not feel the leakage.  Pt does feel the urge urinate but it is beyond the time the time to go, with leakaging before making it to the bathroom .   Leakage also occurs with walking .  2) numbness in B feet:  occurs with walking, walking 3 mi daily without stretching routine. Numbness eases off after 1 mile.  Somedays with  numbness and other days numbness is ok.   PERTINENT HISTORY:    PAIN:  Are you having pain? No  PRECAUTIONS: None  WEIGHT BEARING RESTRICTIONS: No  FALLS:  Has patient fallen in last 6 months? No  LIVING ENVIRONMENT: Lives with: wife  Lives in: two story  Stairs: STE 4 with rail,  steps to above garage apartment with rail    OCCUPATION: retired for the past 9 years,  active caring for grandchildren, walking 3 mi daily without stretching routine   PLOF: IND   PATIENT GOALS:   Not to wear depends   OBJECTIVE:    [facility] PT Assessment - 06/13/24 0934       Lunges   Comments backward stepping with  knee against chair for propiocption amd visual cues with lines on floor for back foot      Sit to Stand   Comments narrow BOS       Posture/Postural Control   Posture Comments more upright posture      Palpation   SI assessment  levelled pelvis and shoulder          [facility] Adult PT Treatment/Exercise - 06/13/24 0934       Neuro Re-ed    Neuro Re-ed Details  backward stepping with knee against chair for propiocption amd visual cues with lines on floor for back foot  ,  propioception and tactile cues for multidifis twist in seated position , required elveated seat to accomodate his tall height to maintain anterior pelvic tilt ,  cued for trunk rotation and arm swings,  longer strides and higher knees with walking      Exercises   Exercises Other Exercises    Other Exercises  see pt instructions              HOME EXERCISE PROGRAM: See pt instruction section    ASSESSMENT:  CLINICAL IMPRESSION:                          Pt showed much improved stacked posture and no more asymmetries, thus, advanced pt to strengthening phase     Added backward stepping with knee against chair for propiocption amd visual cues with lines on floor for back foot . Provided  propioception and tactile cues for multidifis twist in seated position , required eleeated seat to accomodate his tall height to maintain anterior pelvic tilt .   Provided gait training with cues for trunk rotation and arm swings,  longer strides and higher knees with walking to optimize posterior chain of mm activation. Anticipate numbness in feet will improve with longer strides, stronger push off and the new backward stepping exercise which helps with push off and plantar fascia mobility.  Anticipate stacked posture and alignment of spine and pelvic and improved SIJ mobility and glut strenghtening, posterior thoracolumbar strenghtening will  help promote optimize IAP system for improved pelvic floor function, trunk stability, gait, balance, stabilization with mobility tasks.  Plan to address pelvic floor issues once pelvis and spine are  realigned to yield better outcomes.        Regional interdependent approaches will yield greater benefits in pt's POC       Plan to perform sensory test on B feet next session before treating misalignment of spine and pelvis.  Plan to continue with restoring stacked spine posture and improving lumbosacral rhythm/ mobility to promote continence.                                                      Pt benefits from skilled PT.    OBJECTIVE IMPAIRMENTS decreased activity tolerance, decreased coordination, decreased endurance, decreased mobility, difficulty walking, decreased ROM, decreased strength, decreased safety awareness, hypomobility, increased muscle spasms, impaired flexibility, improper body mechanics, postural dysfunction, and pain. scar restrictions   ACTIVITY LIMITATIONS  self-care,  home chores, work tasks    PARTICIPATION LIMITATIONS:  community, walking activities    PERSONAL FACTORS   affecting patient's functional outcome:    REHAB POTENTIAL: Good   CLINICAL DECISION MAKING: Evolving/moderate complexity   EVALUATION COMPLEXITY: Moderate    PATIENT EDUCATION:    Education details: Showed pt anatomy images. Explained muscles attachments/ connection, physiology of deep core system/ spinal- thoracic-pelvis-lower kinetic chain as they relate to pt's presentation, Sx, and past Hx. Explained what and how these areas of deficits need to be restored to balance and function    See Therapeutic activity / neuromuscular re-education section  Answered pt's questions.   Person educated: Patient Education method: Explanation, Demonstration, Tactile cues, Verbal cues, and Handouts Education comprehension: verbalized understanding, returned demonstration, verbal cues required, tactile cues required, and needs further education     PLAN: PT FREQUENCY: 1x/week   PT DURATION: 10 weeks   PLANNED INTERVENTIONS:   Gait training;Stair training;Functional mobility training;DME  Instruction;Therapeutic activities;Therapeutic exercise;Balance training;Neuromuscular re-education;Patient/family education;Vestibular;Visual/perceptual remediation/compensation;Passive range of motion;Moist Heat;Cryotherapy;Traction;Canalith Repostioning;Joint Manipulations;Manual lymph drainage;Manual techniques;Scar mobilization;Energy conservation;Dry needling;ADLs/Self Care Home Management;Biofeedback;Electrical Stimulation;Taping    PLAN FOR NEXT SESSION: See clinical impression for plan     GOALS: Goals reviewed with patient? Yes  SHORT TERM GOALS: Target date: 06/26/2024    Pt will demo IND with HEP                    Baseline: Not IND            Goal status: INITIAL   LONG TERM GOALS: Target date: 08/07/2024    1.Pt will demo proper deep core coordination without chest breathing and optimal excursion of diaphragm/pelvic floor in order to promote spinal stability and pelvic floor function  Baseline: dyscoordination Goal status: INITIAL  2.  Pt will demo proper body mechanics in against gravity tasks and ADLs  work tasks, fitness  to minimize straining pelvic floor / back    Baseline: not IND, improper form that places strain on pelvic floor  Goal status: INITIAL    3. Pt will demo increased gait speed > 1.3 m/s with reciprocal gait pattern, longer stride length  in order to ambulate safely in community and return to fitness  routine  Baseline: 0.97 m/s , decreased stance on R, L adducted knees , asymmetries to spine , limited hip /knee flexion Goal status: INITIAL    4. Pt will demo levelled pelvic girdle and shoulder height in order to progress to deep core strengthening HEP and restore mobility at spine, pelvis, gait, posture minimize falls, and improve balance  Baseline: R shoulder, R iliac crest lowered  Goal status: INITIAL   5. Pt will improve IPPS questionnaire to  pts  score change  to demo improved QOL  Baseline:  ( greater pts indicate greater negative  impact on QOL)  10   pts  ( total)  moderate range     5  pts unhappy ( QOL score)   Baseline:  Goal status: INITIAL  6. Pt will be IND with stretching routine post walking for optimal flexibility and continence. Pt will also report <50% leakage with walking . Pt will report B feet numbness occurring less in intensity or at > 2 miles   Baseline: :leakage occurs with walking, B feet numbness occur at 1 mile point with walking routine  GOAL STATUS: initial    7. Pt will report gained sensation of leakage > 1 x day and report decreased diaper wear from 3 x a day to < 2 x diapers   Baseline: only notices when the diaper is full , once a day, pt senses leakage  Goal Status: Initial   8. Sensory test to be performed for numbness  Baseline: to be assessed Goal Status: Initial    Pia Lupe Plump, PT 06/13/2024, 10:20 AM

## 2024-06-14 ENCOUNTER — Ambulatory Visit: Admitting: Urology

## 2024-06-14 ENCOUNTER — Other Ambulatory Visit

## 2024-06-14 ENCOUNTER — Ambulatory Visit
Admission: RE | Admit: 2024-06-14 | Discharge: 2024-06-14 | Disposition: A | Discharge status: Home / Self Care | Source: Ambulatory Visit | Attending: Urology | Admitting: Urology

## 2024-06-14 ENCOUNTER — Ambulatory Visit
Admission: RE | Admit: 2024-06-14 | Discharge: 2024-06-14 | Disposition: A | Discharge status: Home / Self Care | Attending: Urology | Admitting: Urology

## 2024-06-14 VITALS — BP 124/78 | HR 91 | Ht 73.0 in | Wt 209.0 lb

## 2024-06-14 DIAGNOSIS — R3914 Feeling of incomplete bladder emptying: Secondary | ICD-10-CM | POA: Diagnosis not present

## 2024-06-14 DIAGNOSIS — N200 Calculus of kidney: Secondary | ICD-10-CM | POA: Insufficient documentation

## 2024-06-14 DIAGNOSIS — N210 Calculus in bladder: Secondary | ICD-10-CM | POA: Diagnosis not present

## 2024-06-14 DIAGNOSIS — N401 Enlarged prostate with lower urinary tract symptoms: Secondary | ICD-10-CM | POA: Diagnosis not present

## 2024-06-14 DIAGNOSIS — N393 Stress incontinence (female) (male): Secondary | ICD-10-CM | POA: Diagnosis not present

## 2024-06-14 LAB — BLADDER SCAN AMB NON-IMAGING

## 2024-06-14 NOTE — Patient Instructions (Addendum)
 Dr Lovie is a urologist with [facility] in [place] who performs incontinence procedures.  A referral has been placed, you can also call his office at [phone] to coordinate an appointment.

## 2024-06-14 NOTE — Progress Notes (Signed)
   06/14/2024 11:17 AM   Charles Mccullough 11/24/1955 [id number]  Reason for visit: Follow up incontinence after HOLEP, left renal stone  History: Previously followed by Dr. Penne, underwent cystolitholapaxy and HOLEP with her March 2025, pathology benign Has had fairly significant and persistent stress incontinence since that time 2 cm left lower pole stone on surveillance  Physical Exam: BP 124/78 (BP Location: Left Arm, Patient Position: Sitting, Cuff Size: Normal)   Pulse 91   Ht 6' 1 (1.854 m)   Wt 209 lb (94.8 kg)   SpO2 97%   BMI 27.57 kg/m   Imaging/labs: I personally viewed and interpreted the KUB today showing a stable 2 cm left lower pole stone  Today: No significant improvement in stress incontinence since his surgery, he voids with a strong stream but using 3 depends during the day for stress incontinence when physically active.  No significant leakage overnight.  No significant urgency or urge incontinence.  PVR today 0ml. Started pelvic floor PT about 3 weeks ago, he mentions possibly some slight improvement so far Denies any flank pain, gross hematuria, or recurrent UTI  Plan:   Stress incontinence: Concerning this is still persistent and unchanged 7 months after HOLEP.  Agree with working with pelvic floor PT, and referral was placed to Dr. Lovie in [place] to consider incontinence procedures like sling or sphincter Left renal stone: Recommended surveillance for now with stable stone and addressing incontinence first, we discussed any cystoscopy or ureteroscopy could exacerbate his stress incontinence Referral placed to Dr. Lovie to consider continence procedures, surveillance of left lower pole renal stone, RTC with me 6 months PVR, KUB   Charles JAYSON Burnet, MD  [facility] [address] [place], [place] [address] [phone]

## 2024-06-15 ENCOUNTER — Ambulatory Visit: Admitting: Urology

## 2024-06-20 ENCOUNTER — Ambulatory Visit: Attending: Internal Medicine | Admitting: Physical Therapy

## 2024-06-20 DIAGNOSIS — R200 Anesthesia of skin: Secondary | ICD-10-CM | POA: Insufficient documentation

## 2024-06-20 DIAGNOSIS — M533 Sacrococcygeal disorders, not elsewhere classified: Secondary | ICD-10-CM | POA: Diagnosis present

## 2024-06-20 DIAGNOSIS — R202 Paresthesia of skin: Secondary | ICD-10-CM | POA: Insufficient documentation

## 2024-06-20 DIAGNOSIS — R2689 Other abnormalities of gait and mobility: Secondary | ICD-10-CM | POA: Insufficient documentation

## 2024-06-20 NOTE — Patient Instructions (Signed)
  Lengthen Back rib by R  shoulder   ( winging)  Lie on L  side , pillow between knees and under head  Pull  R arm overhead over mattress, grab the edge of mattress,pull it upward, drawing elbow away from ears  Breathing 10 reps Brushing arm with 3/4 turn onto pillow behind back  Lying on L  side ,Pillow/ Block between knees  dragging top forearm across ribs below breast rotating 3/4 turn,  rotating  _R_ only this week ,  relax onto the pillow behind the back  and then back to other palm , maintain top palm on body whole top and not lift shoulder Do this side this week       Wait do both sides until we have levelled out your spine    10 reps  __    Glut strengthening by the chair   - shoulders down and back -Square hips and toes  -feet hip width apart   30 reps each leg

## 2024-06-20 NOTE — Therapy (Signed)
 OUTPATIENT PHYSICAL THERAPY TREATMENT   Patient Name: Charles Mccullough MRN: [id number] DOB:08-23-1955, 68 y.o., male Today's Date: 06/20/2024   PT End of Session - 06/20/24 1040     Visit Number 4    Number of Visits 10    Date for Recertification  08/07/24    PT Start Time 1036    PT Stop Time 1115    PT Time Calculation (min) 39 min    Activity Tolerance Patient tolerated treatment well;No increased pain    Behavior During Therapy [facility] for tasks assessed/performed          Past Medical History:  Diagnosis Date   Aortic atherosclerosis    Arthritis    Bladder stone    BPH with urinary obstruction    Complication of anesthesia    a.) prolonged/delayed emergence   Coronary artery disease    Diastolic dysfunction    a.) TTE 11/10/2019: EF >55%, no RWMAs, G1DD, norm RVSF, mild MAC, triv panval regurg   Diverticulosis    History of bilateral cataract extraction    HLD (hyperlipidemia)    Hypertension    Kidney stones 2004   Long-term use of aspirin therapy    Pyelonephritis 2004   RBBB (right bundle branch block)    Staghorn calculus    T2DM (type 2 diabetes mellitus) (HCC)    Past Surgical History:  Procedure Laterality Date   COLONOSCOPY     x 3   COLONOSCOPY W/ POLYPECTOMY     CYSTOSCOPY WITH LITHOLAPAXY N/A 11/01/2023   Procedure: CYSTOSCOPY, WITH BLADDER CALCULUS LITHOLAPAXY;  Surgeon: Penne Knee, MD;  Location: [facility];  Service: Urology;  Laterality: N/A;   EXTENSOR TENDON OF FOREARM / WRIST REPAIR Right    EYE SURGERY Bilateral    cataracts with lens replace   HOLEP-LASER ENUCLEATION OF THE PROSTATE WITH MORCELLATION N/A 11/01/2023   Procedure: ENUCLEATION, PROSTATE, USING LASER, WITH MORCELLATION;  Surgeon: Penne Knee, MD;  Location: [facility];  Service: Urology;  Laterality: N/A;   KIDNEY STONE SURGERY     There are no active problems to display for this patient.   ERE:Dejmxd   REFERRING PROVIDER: Sparks   REFERRING DIAG: R32 (ICD-10-CM) -  Urinary incontinence, unspecified type  Rationale for Evaluation and Treatment Rehabilitation  THERAPY DIAG:  Sacrococcygeal disorders, not elsewhere classified  Other abnormalities of gait and mobility  Numbness and tingling of both feet  ONSET DATE:   SUBJECTIVE:   SUBJECTIVE STATEMENT TODAY:  Pt saw urologist and was told he had a kidney stone which is being monitored.   Pt also noticed this week, he is  able to sense when he is leaking compared to not sensing leakage in the past weeks. Pt is doing his HEP.  SUBJECTIVE STATEMENT ON EVAL 05/29/24: 1)  Urinary leakage after HOLEP procedure for BPH  March 2025  :  changes Depends diapers 3 x a day . No lekaage at night . It seems worse.  Urine stream improved post surgery.  Pt notices heaviness filling up  of diaper. Pt does not feel the leakage.  Pt does feel the urge urinate but it is beyond the time the time to go, with leakaging before making it to the bathroom .   Leakage also occurs with walking .  2) numbness in B feet:  occurs with walking, walking 3 mi daily without stretching routine. Numbness eases off after 1 mile.  Somedays with  numbness and other days numbness is ok.   PERTINENT HISTORY:    PAIN:  Are you having pain? No  PRECAUTIONS: None  WEIGHT BEARING RESTRICTIONS: No  FALLS:  Has patient fallen in last 6 months? No  LIVING ENVIRONMENT: Lives with: wife  Lives in: two story  Stairs: STE 4 with rail,  steps to above garage apartment with rail    OCCUPATION: retired for the past 9 years,  active caring for grandchildren, walking 3 mi daily without stretching routine   PLOF: IND   PATIENT GOALS:   Not to wear depends   OBJECTIVE:   [facility] PT Assessment - 06/20/24 1109       Palpation   SI assessment  R shoulder, L iliac crest  lowered,    Palpation comment deviated T10-12, interspinal/ paraspinal mm tightenss, limited anterior/ lateral excursion R T10-12          [facility] Adult PT Treatment/Exercise - 06/20/24 1110       Self-Care   Other Self-Care Comments  explained the role of glut strengthening for anterior tilt of pelvis for stacked posture and IAP function  , showed anatomy images for mm involved in HEP   Explained regaining sensation of leakage is positive sign of improvement with improved stacked posture helps with improved nn conduction,       Neuro Re-ed    Neuro Re-ed Details  provided propioceptive and motor planning cues for new thoracic mobility HEP and for logrolling and new glut strengthening HEP      Manual Therapy   Manual therapy comments STM/MWM at problem areas noted in assessment to promote realignment of spine              HOME EXERCISE PROGRAM: See pt instruction section    ASSESSMENT:  CLINICAL IMPRESSION:                             Pt showed R shoulder/ L iliac crest lowered today and required more manual Tx to thoracic area to realign spine and optimize anterio/lateral excursion of diaphragm R.  Post Tx, pt showed more levelled shoulders/pelvis and more diaphragm excursion which will further help with IAP function and continence. Provided propioceptive and motor planning cues for new thoracic mobility HEP and for logrolling and new glut strengthening HEP. Explained the role of glut strengthening for anterior tilt of pelvis for stacked posture and IAP function  , showed anatomy images for mm involved in HEP   Pt showed maintenance of stacked posture and reported this past week, , he is  able to sense when he is leaking compared to not sensing leakage in the past weeks.  Explained regaining sensation of leakage is positive sign of improvement with improved stacked posture helps with  improved nn conduction,                              Anticipate stacked posture and alignment  of spine and pelvic and improved SIJ mobility and glut strenghtening, posterior thoracolumbar strenghtening will  help promote optimize IAP system for improved pelvic floor function, trunk stability, gait, balance, stabilization with mobility tasks.  Plan to address pelvic floor issues once pelvis and spine are realigned to yield better outcomes.     Regional interdependent approaches will yield greater benefits in pt's POC  Plan to add deep core training once pt demonstrates improved diaphragm excursion improvement bilaterally.       Plan to perform sensory test on B feet next session before treating misalignment of spine and pelvis.  Plan to continue with restoring stacked spine posture and improving lumbosacral rhythm/ mobility to promote continence.                                                      Pt benefits from skilled PT.    OBJECTIVE IMPAIRMENTS decreased activity tolerance, decreased coordination, decreased endurance, decreased mobility, difficulty walking, decreased ROM, decreased strength, decreased safety awareness, hypomobility, increased muscle spasms, impaired flexibility, improper body mechanics, postural dysfunction, and pain. scar restrictions   ACTIVITY LIMITATIONS  self-care,  home chores, work tasks    PARTICIPATION LIMITATIONS:  community, walking activities    PERSONAL FACTORS   affecting patient's functional outcome:    REHAB POTENTIAL: Good   CLINICAL DECISION MAKING: Evolving/moderate complexity   EVALUATION COMPLEXITY: Moderate    PATIENT EDUCATION:    Education details: Showed pt anatomy images. Explained muscles attachments/ connection, physiology of deep core system/ spinal- thoracic-pelvis-lower kinetic chain as they relate to pt's presentation, Sx, and past Hx. Explained what and how these areas of deficits need to be restored to balance and function    See Therapeutic activity / neuromuscular re-education section  Answered pt's questions.    Person educated: Patient Education method: Explanation, Demonstration, Tactile cues, Verbal cues, and Handouts Education comprehension: verbalized understanding, returned demonstration, verbal cues required, tactile cues required, and needs further education     PLAN: PT FREQUENCY: 1x/week   PT DURATION: 10 weeks   PLANNED INTERVENTIONS:   Gait training;Stair training;Functional mobility training;DME Instruction;Therapeutic activities;Therapeutic exercise;Balance training;Neuromuscular re-education;Patient/family education;Vestibular;Visual/perceptual remediation/compensation;Passive range of motion;Moist Heat;Cryotherapy;Traction;Canalith Repostioning;Joint Manipulations;Manual lymph drainage;Manual techniques;Scar mobilization;Energy conservation;Dry needling;ADLs/Self Care Home Management;Biofeedback;Electrical Stimulation;Taping    PLAN FOR NEXT SESSION: See clinical impression for plan     GOALS: Goals reviewed with patient? Yes  SHORT TERM GOALS: Target date: 06/26/2024    Pt will demo IND with HEP                    Baseline: Not IND            Goal status: INITIAL   LONG TERM GOALS: Target date: 08/07/2024    1.Pt will demo proper deep core coordination without chest breathing and optimal excursion of diaphragm/pelvic floor in order to promote spinal stability and pelvic floor function  Baseline: dyscoordination Goal status: INITIAL  2.  Pt will demo proper body mechanics in against gravity tasks and ADLs  work tasks, fitness  to minimize straining pelvic floor / back    Baseline: not IND,  improper form that places strain on pelvic floor  Goal status: INITIAL    3. Pt will demo increased gait speed > 1.3 m/s with reciprocal gait pattern, longer stride length  in order to ambulate safely in community and return to fitness routine  Baseline: 0.97 m/s , decreased stance on R, L adducted knees , asymmetries to spine , limited hip /knee flexion Goal status:  INITIAL    4. Pt will demo levelled pelvic girdle and shoulder height in order to progress to deep core strengthening HEP and restore mobility at spine, pelvis, gait, posture minimize falls, and improve balance  Baseline: R shoulder, R iliac crest lowered  Goal status: INITIAL   5. Pt will improve IPPS questionnaire to  pts  score change  to demo improved QOL  Baseline:  ( greater pts indicate greater negative impact on QOL)  10   pts  ( total)  moderate range     5  pts unhappy ( QOL score)   Baseline:  Goal status: INITIAL  6. Pt will be IND with stretching routine post walking for optimal flexibility and continence. Pt will also report <50% leakage with walking . Pt will report B feet numbness occurring less in intensity or at > 2 miles   Baseline: :leakage occurs with walking, B feet numbness occur at 1 mile point with walking routine  GOAL STATUS: initial    7. Pt will report gained sensation of leakage > 1 x day and report decreased diaper wear from 3 x a day to < 2 x diapers   Baseline: only notices when the diaper is full , once a day, pt senses leakage  Goal Status: Initial   8. Sensory test to be performed for numbness  Baseline: to be assessed Goal Status: Initial    Pia Lupe Plump, PT 06/20/2024, 10:41 AM

## 2024-06-27 ENCOUNTER — Ambulatory Visit: Admitting: Physical Therapy

## 2024-06-27 DIAGNOSIS — R200 Anesthesia of skin: Secondary | ICD-10-CM

## 2024-06-27 DIAGNOSIS — M533 Sacrococcygeal disorders, not elsewhere classified: Secondary | ICD-10-CM

## 2024-06-27 DIAGNOSIS — R2689 Other abnormalities of gait and mobility: Secondary | ICD-10-CM

## 2024-06-27 NOTE — Therapy (Unsigned)
 OUTPATIENT PHYSICAL THERAPY TREATMENT   Patient Name: Charles Mccullough MRN: [id number] DOB:[birth year], 68 y.o., male Today's Date: 06/27/2024   PT End of Session - 06/27/24 0854     Visit Number 5    Number of Visits 10    Date for Recertification  08/07/24    PT Start Time 0850    PT Stop Time 0930    PT Time Calculation (min) 40 min    Activity Tolerance Patient tolerated treatment well;No increased pain    Behavior During Therapy Charles Mccullough for tasks assessed/performed          Past Medical History:  Diagnosis Date   Aortic atherosclerosis    Arthritis    Bladder stone    BPH with urinary obstruction    Complication of anesthesia    a.) prolonged/delayed emergence   Coronary artery disease    Diastolic dysfunction    a.) TTE 11/10/2019: EF >55%, no RWMAs, G1DD, norm RVSF, mild MAC, triv panval regurg   Diverticulosis    History of bilateral cataract extraction    HLD (hyperlipidemia)    Hypertension    Kidney stones 2004   Long-term use of aspirin therapy    Pyelonephritis 2004   RBBB (right bundle branch block)    Staghorn calculus    T2DM (type 2 diabetes mellitus) (HCC)    Past Surgical History:  Procedure Laterality Date   COLONOSCOPY     x 3   COLONOSCOPY W/ POLYPECTOMY     CYSTOSCOPY WITH LITHOLAPAXY N/A 11/01/2023   Procedure: CYSTOSCOPY, WITH BLADDER CALCULUS LITHOLAPAXY;  Surgeon: Penne Knee, MD;  Location: [facility];  Service: Urology;  Laterality: N/A;   EXTENSOR TENDON OF FOREARM / WRIST REPAIR Right    EYE SURGERY Bilateral    cataracts with lens replace   HOLEP-LASER ENUCLEATION OF THE PROSTATE WITH MORCELLATION N/A 11/01/2023   Procedure: ENUCLEATION, PROSTATE, USING LASER, WITH MORCELLATION;  Surgeon: Penne Knee, MD;  Location: [facility];  Service: Urology;  Laterality: N/A;   KIDNEY STONE SURGERY     There are no active problems to display for this patient.   ERE:Dejmxd   REFERRING PROVIDER: Sparks   REFERRING DIAG: R32 (ICD-10-CM) -  Urinary incontinence, unspecified type  Rationale for Evaluation and Treatment Rehabilitation  THERAPY DIAG:  Sacrococcygeal disorders, not elsewhere classified  Other abnormalities of gait and mobility  Numbness and tingling of both feet  ONSET DATE:   SUBJECTIVE:   SUBJECTIVE STATEMENT TODAY:  Pt  reports  able to sense leakage occurring instead of noticing diaper is full .  B numbness only occurred once at 1 mile during last week  History of feet:  fractured R foot without surgery. R plantar fasciitis, has been wearing orthotics for arch support for 11 years.  Within the past 2 years, pt feels the numbness of feet only occurring with walking but not barefooted. Pt feels the feel as if he is walking on something squishy like foam .     SUBJECTIVE STATEMENT ON EVAL 05/29/24: 1)  Urinary leakage after HOLEP procedure for BPH  March 2025  :  changes Depends diapers 3 x a day . No lekaage at night . It seems worse.  Urine stream improved post surgery.  Pt notices heaviness filling up  of diaper. Pt does not feel the leakage.  Pt does feel the urge urinate but it is beyond the time the time to go, with leakaging before making it to the bathroom .   Leakage also  occurs with walking .  2) numbness in B feet:  occurs with walking, walking 3 mi daily without stretching routine. Numbness eases off after 1 mile.  Somedays with  numbness and other days numbness is ok.   PERTINENT HISTORY:    PAIN:  Are you having pain? No  PRECAUTIONS: None  WEIGHT BEARING RESTRICTIONS: No  FALLS:  Has patient fallen in last 6 months? No  LIVING ENVIRONMENT: Lives with: wife  Lives in: two story  Stairs: STE 4 with rail,  steps to above garage apartment with rail    OCCUPATION: retired for the past 9 years,  active caring for grandchildren, walking 3 mi daily without stretching routine   PLOF: IND   PATIENT GOALS:   Not to wear depends   OBJECTIVE:      HOME EXERCISE  PROGRAM: See pt instruction section    ASSESSMENT:  CLINICAL IMPRESSION:  Improvements:   Pt  reports able to sense leakage occurring instead of noticing diaper is full .  B numbness only occurred once at 1 mile during last week. Pt continues to walk 3 miles a day                                Anticipate stacked posture and alignment of spine and pelvic and improved SIJ mobility and glut strenghtening, posterior thoracolumbar strenghtening will  help promote optimize IAP system for improved pelvic floor function, trunk stability, gait, balance, stabilization with mobility tasks.  Plan to address pelvic floor issues once pelvis and spine are realigned to yield better outcomes.     Regional interdependent approaches will yield greater benefits in pt's POC  Plan to add deep core training once pt demonstrates improved diaphragm excursion improvement bilaterally.       Plan to perform sensory test on B feet next session before treating misalignment of spine and pelvis.  Plan to continue with restoring stacked spine posture and improving lumbosacral rhythm/ mobility to promote continence.                                                      Pt benefits from skilled PT.    OBJECTIVE IMPAIRMENTS decreased activity tolerance, decreased coordination, decreased endurance, decreased mobility, difficulty walking, decreased ROM, decreased strength, decreased safety awareness, hypomobility, increased muscle spasms, impaired flexibility, improper body mechanics, postural dysfunction, and pain. scar restrictions   ACTIVITY LIMITATIONS  self-care,  home chores, work tasks    PARTICIPATION LIMITATIONS:  community, walking activities    PERSONAL FACTORS   affecting patient's functional outcome:    REHAB POTENTIAL: Good   CLINICAL DECISION MAKING: Evolving/moderate complexity   EVALUATION COMPLEXITY: Moderate    PATIENT EDUCATION:    Education details: Showed pt anatomy images. Explained  muscles attachments/ connection, physiology of deep core system/ spinal- thoracic-pelvis-lower kinetic chain as they relate to pt's presentation, Sx, and past Hx. Explained what and how these areas of deficits need to be restored to balance and function    See Therapeutic activity / neuromuscular re-education section  Answered pt's questions.   Person educated: Patient Education method: Explanation, Demonstration, Tactile cues, Verbal cues, and Handouts Education comprehension: verbalized understanding, returned demonstration, verbal cues required, tactile cues required, and needs further education     PLAN: PT  FREQUENCY: 1x/week   PT DURATION: 10 weeks   PLANNED INTERVENTIONS:   Gait training;Stair training;Functional mobility training;DME Instruction;Therapeutic activities;Therapeutic exercise;Balance training;Neuromuscular re-education;Patient/family education;Vestibular;Visual/perceptual remediation/compensation;Passive range of motion;Moist Heat;Cryotherapy;Traction;Canalith Repostioning;Joint Manipulations;Manual lymph drainage;Manual techniques;Scar mobilization;Energy conservation;Dry needling;ADLs/Self Care Home Management;Biofeedback;Electrical Stimulation;Taping    PLAN FOR NEXT SESSION: See clinical impression for plan     GOALS: Goals reviewed with patient? Yes  SHORT TERM GOALS: Target date: 06/26/2024    Pt will demo IND with HEP                    Baseline: Not IND            Goal status: INITIAL   LONG TERM GOALS: Target date: 08/07/2024    1.Pt will demo proper deep core coordination without chest breathing and optimal excursion of diaphragm/pelvic floor in order to promote spinal stability and pelvic floor function  Baseline: dyscoordination Goal status: INITIAL  2.  Pt will demo proper body mechanics in against gravity tasks and ADLs  work tasks, fitness  to minimize straining pelvic floor / back    Baseline: not IND, improper form that places strain  on pelvic floor  Goal status: INITIAL    3. Pt will demo increased gait speed > 1.3 m/s with reciprocal gait pattern, longer stride length  in order to ambulate safely in community and return to fitness routine  Baseline: 0.97 m/s , decreased stance on R, L adducted knees , asymmetries to spine , limited hip /knee flexion Goal status: INITIAL    4. Pt will demo levelled pelvic girdle and shoulder height in order to progress to deep core strengthening HEP and restore mobility at spine, pelvis, gait, posture minimize falls, and improve balance  Baseline: R shoulder, R iliac crest lowered  Goal status:  Partially met ( pelvis levelled, L shoulder slightly higher .  06/27/24  5th visit)     5. Pt will improve IPPS questionnaire to  pts  score change  to demo improved QOL  Baseline:  ( greater pts indicate greater negative impact on QOL)  10   pts  ( total)  moderate range     5  pts unhappy ( QOL score)   Baseline:  Goal status: INITIAL  6. Pt will be IND with stretching routine post walking for optimal flexibility and continence. Pt will also report <50% leakage with walking . Pt will report B feet numbness occurring less in intensity or at > 2 miles   Baseline: :leakage occurs with walking, B feet numbness occur at 1 mile point with walking routine  GOAL STATUS: partially met ( 06/27/24  Visit 5 : B numbness only occurred once at 1 mile during last week)    7. Pt will report gained sensation of leakage > 1 x day and report decreased diaper wear from 3 x a day to < 2 x diapers   Baseline: only notices when the diaper is full , once a day, pt senses leakage  Goal Status: Partially Met ( 06/27/24:  able to sense leakage occurring instead of noticing diaper is full)    8. Sensory test to be performed for numbness  Baseline: to be assessed Goal Status: Initial    Charles Mccullough, PT 06/27/2024, 8:55 AM

## 2024-07-04 ENCOUNTER — Ambulatory Visit: Admitting: Physical Therapy

## 2024-07-04 DIAGNOSIS — R2689 Other abnormalities of gait and mobility: Secondary | ICD-10-CM

## 2024-07-04 DIAGNOSIS — M533 Sacrococcygeal disorders, not elsewhere classified: Secondary | ICD-10-CM | POA: Diagnosis not present

## 2024-07-04 DIAGNOSIS — R200 Anesthesia of skin: Secondary | ICD-10-CM

## 2024-07-04 NOTE — Therapy (Signed)
 OUTPATIENT PHYSICAL THERAPY TREATMENT   Patient Name: Charles Mccullough MRN: [id number] DOB:[birth year], 68 y.o., male Today's Date: 07/04/2024   PT End of Session - 07/04/24 0855     Visit Number 6    Number of Visits 10    Date for Recertification  08/07/24    PT Start Time 0850    PT Stop Time 0930    PT Time Calculation (min) 40 min    Activity Tolerance Patient tolerated treatment well;No increased pain    Behavior During Therapy [facility] for tasks assessed/performed          Past Medical History:  Diagnosis Date   Aortic atherosclerosis    Arthritis    Bladder stone    BPH with urinary obstruction    Complication of anesthesia    a.) prolonged/delayed emergence   Coronary artery disease    Diastolic dysfunction    a.) TTE 11/10/2019: EF >55%, no RWMAs, G1DD, norm RVSF, mild MAC, triv panval regurg   Diverticulosis    History of bilateral cataract extraction    HLD (hyperlipidemia)    Hypertension    Kidney stones 2004   Long-term use of aspirin therapy    Pyelonephritis 2004   RBBB (right bundle branch block)    Staghorn calculus    T2DM (type 2 diabetes mellitus) (HCC)    Past Surgical History:  Procedure Laterality Date   COLONOSCOPY     x 3   COLONOSCOPY W/ POLYPECTOMY     CYSTOSCOPY WITH LITHOLAPAXY N/A 11/01/2023   Procedure: CYSTOSCOPY, WITH BLADDER CALCULUS LITHOLAPAXY;  Surgeon: Penne Knee, MD;  Location: [facility] ORS;  Service: Urology;  Laterality: N/A;   EXTENSOR TENDON OF FOREARM / WRIST REPAIR Right    EYE SURGERY Bilateral    cataracts with lens replace   HOLEP-LASER ENUCLEATION OF THE PROSTATE WITH MORCELLATION N/A 11/01/2023   Procedure: ENUCLEATION, PROSTATE, USING LASER, WITH MORCELLATION;  Surgeon: Penne Knee, MD;  Location: [facility] OR[facility];  Service: Urology;  Laterality: N/A;   KIDNEY STONE SURGERY     There are no active problems to display for this patient.   ERE:Dejmxd   REFERRING PROVIDER: Sparks   REFERRING DIAG: R32 (ICD-10-CM) -  Urinary incontinence, unspecified type  Rationale for Evaluation and Treatment Rehabilitation  THERAPY DIAG:  Sacrococcygeal disorders, not elsewhere classified  Other abnormalities of gait and mobility  Numbness and tingling of both feet  ONSET DATE:   SUBJECTIVE:   SUBJECTIVE STATEMENT TODAY:  Pt  reports removal of stiff arch support have help pt walk 3 miles without nnumbness of the feet. Pt continues with the feet mobility exercises which helped feet numbness   SUBJECTIVE STATEMENT ON EVAL 05/29/24: 1)  Urinary leakage after HOLEP procedure for BPH  March 2025  :  changes Depends diapers 3 x a day . No lekaage at night . It seems worse.  Urine stream improved post surgery.  Pt notices heaviness filling up  of diaper. Pt does not feel the leakage.  Pt does feel the urge urinate but it is beyond the time the time to go, with leakaging before making it to the bathroom .   Leakage also occurs with walking .  2) numbness in B feet:  occurs with walking, walking 3 mi daily without stretching routine. Numbness eases off after 1 mile.  Somedays with  numbness and other days numbness is ok.   PERTINENT HISTORY:    PAIN:  Are you having pain? No  PRECAUTIONS: None  WEIGHT  BEARING RESTRICTIONS: No  FALLS:  Has patient fallen in last 6 months? No  LIVING ENVIRONMENT: Lives with: wife  Lives in: two story  Stairs: STE 4 with rail,  steps to above garage apartment with rail    OCCUPATION: retired for the past 9 years,  active caring for grandchildren, walking 3 mi daily without stretching routine   PLOF: IND   PATIENT GOALS:   Not to wear depends   OBJECTIVE:   [facility] PT Assessment - 07/04/24 0855       Palpation   SI assessment  tenderness and deviated L 2 segment to L, SIJ hypomobility B    Palpation comment cramping with knees flexion in prone,           [facility] Adult PT Treatment/Exercise - 07/04/24 0855       Ambulation/Gait   Gait Comments more trunk  rotation, asrm swing,  more toe extension push off,  longer stride, propioception cues for more arm swings and trunk rotation  ,      Neuro Re-ed    Neuro Re-ed Details  cued for propioception and alignment for spinals and pelvic floor stretches      Manual Therapy   Manual therapy comments PA mob L L3, STM/MWM at paraspinals,  superior glide with SIJ B to promote anterior rotation of ilia            HOME EXERCISE PROGRAM: See pt instruction section    ASSESSMENT:  CLINICAL IMPRESSION:  Improvements:   Pt  reports able to sense leakage occurring instead of noticing diaper is full .  B numbness  resolved since removing stiff arch support from shoes and maintaining feet mobility HEP. No numbness with walking 3 miles a day   Gait has improved with more feet/ ankle mobility but still limited in trunk mobility, armpswing, limited stride length.    Manual Tx was applied today to improve T/L junction , SIJ mobility in order to further improve longer stride , hip extension which pt demonstrated post Tx.  Cued for propioception and alignment for spinals and pelvic floor stretches   Anticipate stacked posture and alignment of spine and pelvic and improved SIJ mobility and glut strenghtening, posterior thoracolumbar strengthening, and improved stride length/ trunk rotation  will  help promote optimize IAP system for improved pelvic floor function, trunk stability, gait, balance, stabilization with mobility tasks.  Plan to address pelvic floor issues once pelvis and spine are realigned to yield better outcomes.   Regional interdependent approaches will yield greater benefits in pt's POC  Plan to add deep core training once pt demonstrates improved diaphragm excursion improvement bilaterally.                                                          Pt benefits from skilled PT.    OBJECTIVE IMPAIRMENTS decreased activity tolerance, decreased coordination, decreased endurance, decreased  mobility, difficulty walking, decreased ROM, decreased strength, decreased safety awareness, hypomobility, increased muscle spasms, impaired flexibility, improper body mechanics, postural dysfunction, and pain. scar restrictions   ACTIVITY LIMITATIONS  self-care,  home chores, work tasks    PARTICIPATION LIMITATIONS:  community, walking activities    PERSONAL FACTORS   affecting patient's functional outcome:    REHAB POTENTIAL: Good   CLINICAL DECISION MAKING: Evolving/moderate complexity  EVALUATION COMPLEXITY: Moderate    PATIENT EDUCATION:    Education details: Showed pt anatomy images. Explained muscles attachments/ connection, physiology of deep core system/ spinal- thoracic-pelvis-lower kinetic chain as they relate to pt's presentation, Sx, and past Hx. Explained what and how these areas of deficits need to be restored to balance and function    See Therapeutic activity / neuromuscular re-education section  Answered pt's questions.   Person educated: Patient Education method: Explanation, Demonstration, Tactile cues, Verbal cues, and Handouts Education comprehension: verbalized understanding, returned demonstration, verbal cues required, tactile cues required, and needs further education     PLAN: PT FREQUENCY: 1x/week   PT DURATION: 10 weeks   PLANNED INTERVENTIONS:   Gait training;Stair training;Functional mobility training;DME Instruction;Therapeutic activities;Therapeutic exercise;Balance training;Neuromuscular re-education;Patient/family education;Vestibular;Visual/perceptual remediation/compensation;Passive range of motion;Moist Heat;Cryotherapy;Traction;Canalith Repostioning;Joint Manipulations;Manual lymph drainage;Manual techniques;Scar mobilization;Energy conservation;Dry needling;ADLs/Self Care Home Management;Biofeedback;Electrical Stimulation;Taping    PLAN FOR NEXT SESSION: See clinical impression for plan     GOALS: Goals reviewed with patient?  Yes  SHORT TERM GOALS: Target date: 06/26/2024    Pt will demo IND with HEP                    Baseline: Not IND            Goal status: INITIAL   LONG TERM GOALS: Target date: 08/07/2024    1.Pt will demo proper deep core coordination without chest breathing and optimal excursion of diaphragm/pelvic floor in order to promote spinal stability and pelvic floor function  Baseline: dyscoordination Goal status: INITIAL  2.  Pt will demo proper body mechanics in against gravity tasks and ADLs  work tasks, fitness  to minimize straining pelvic floor / back    Baseline: not IND, improper form that places strain on pelvic floor  Goal status: INITIAL    3. Pt will demo increased gait speed > 1.3 m/s with reciprocal gait pattern, longer stride length  in order to ambulate safely in community and return to fitness routine  Baseline: 0.97 m/s , decreased stance on R, L adducted knees , asymmetries to spine , limited hip /knee flexion Goal status: INITIAL    4. Pt will demo levelled pelvic girdle and shoulder height in order to progress to deep core strengthening HEP and restore mobility at spine, pelvis, gait, posture minimize falls, and improve balance  Baseline: R shoulder, R iliac crest lowered  Goal status:  Partially met ( pelvis levelled, L shoulder slightly higher .  06/27/24  5th visit)     5. Pt will improve IPPS questionnaire to  pts  score change  to demo improved QOL  Baseline:  ( greater pts indicate greater negative impact on QOL)  10   pts  ( total)  moderate range     5  pts unhappy ( QOL score)   Baseline:  Goal status: INITIAL  6. Pt will be IND with stretching routine post walking for optimal flexibility and continence. Pt will also report <50% leakage with walking . Pt will report B feet numbness occurring less in intensity or at > 2 miles   Baseline: :leakage occurs with walking, B feet numbness occur at 1 mile point with walking routine  GOAL STATUS:  partially met ( 06/27/24  Visit 5 : B numbness only occurred once at 1 mile during last week)    7. Pt will report gained sensation of leakage > 1 x day and report decreased diaper wear from 3 x a day to <  2 x diapers   Baseline: only notices when the diaper is full , once a day, pt senses leakage  Goal Status: Partially Met ( 06/27/24:  able to sense leakage occurring instead of noticing diaper is full)    8. Sensory test to be performed for numbness  Baseline: to be assessed Goal Status: Initial    Pia Lupe Plump, PT 07/04/2024, 9:36 AM

## 2024-07-04 NOTE — Patient Instructions (Addendum)
   kitchen counter stretches  Hands on kitchen counter,   Palms shoulder width apart  Minisquat postion Trunk is parallel to floor  A) Pull buttocks back to lengthen spine, knees bent  3 breaths   B) Bring R hand to the L, and stretch the R side trunk  3 breaths   Brings hands to center again Do the same to the L side stretch by placing L hand on top of R    C) L hand on L hip, rotate rib and look up at the ceiling L  ( for this week to realign spine)    Later, you can do both sides  ____   Stretch for pelvic floor   On belly: Riding horse edge of mattress  knee bent like riding a horse, move knee towards armpit and out  10 reps

## 2024-07-11 ENCOUNTER — Ambulatory Visit: Admitting: Physical Therapy

## 2024-07-11 DIAGNOSIS — R2689 Other abnormalities of gait and mobility: Secondary | ICD-10-CM

## 2024-07-11 DIAGNOSIS — M533 Sacrococcygeal disorders, not elsewhere classified: Secondary | ICD-10-CM

## 2024-07-11 DIAGNOSIS — R200 Anesthesia of skin: Secondary | ICD-10-CM

## 2024-07-11 NOTE — Therapy (Signed)
 OUTPATIENT PHYSICAL THERAPY TREATMENT   Patient Name: Charles Mccullough MRN: [id number] DOB:[birth year], 68 y.o., male Today's Date: 07/11/2024   PT End of Session - 07/11/24 0854     Visit Number 7    Number of Visits 10    Date for Recertification  08/07/24    PT Start Time 0851    PT Stop Time 0930    PT Time Calculation (min) 39 min    Activity Tolerance Patient tolerated treatment well;No increased pain    Behavior During Therapy [facility] for tasks assessed/performed          Past Medical History:  Diagnosis Date   Aortic atherosclerosis    Arthritis    Bladder stone    BPH with urinary obstruction    Complication of anesthesia    a.) prolonged/delayed emergence   Coronary artery disease    Diastolic dysfunction    a.) TTE 11/10/2019: EF >55%, no RWMAs, G1DD, norm RVSF, mild MAC, triv panval regurg   Diverticulosis    History of bilateral cataract extraction    HLD (hyperlipidemia)    Hypertension    Kidney stones 2004   Long-term use of aspirin therapy    Pyelonephritis 2004   RBBB (right bundle branch block)    Staghorn calculus    T2DM (type 2 diabetes mellitus) (HCC)    Past Surgical History:  Procedure Laterality Date   COLONOSCOPY     x 3   COLONOSCOPY W/ POLYPECTOMY     CYSTOSCOPY WITH LITHOLAPAXY N/A 11/01/2023   Procedure: CYSTOSCOPY, WITH BLADDER CALCULUS LITHOLAPAXY;  Surgeon: Penne Knee, MD;  Location: [facility] OR[facility];  Service: Urology;  Laterality: N/A;   EXTENSOR TENDON OF FOREARM / WRIST REPAIR Right    EYE SURGERY Bilateral    cataracts with lens replace   HOLEP-LASER ENUCLEATION OF THE PROSTATE WITH MORCELLATION N/A 11/01/2023   Procedure: ENUCLEATION, PROSTATE, USING LASER, WITH MORCELLATION;  Surgeon: Penne Knee, MD;  Location: [facility];  Service: Urology;  Laterality: N/A;   KIDNEY STONE SURGERY     There are no active problems to display for this patient.   ERE:Dejmxd   REFERRING PROVIDER: Sparks   REFERRING DIAG: R32 (ICD-10-CM) -  Urinary incontinence, unspecified type  Rationale for Evaluation and Treatment Rehabilitation  THERAPY DIAG:  Sacrococcygeal disorders, not elsewhere classified  Other abnormalities of gait and mobility  Numbness and tingling of both feet  ONSET DATE:   SUBJECTIVE:   SUBJECTIVE STATEMENT TODAY:  Pt  reports  his dtr complimented him on how he is walking straighter. Pt reported no more numbness in the feet across the past 2 weeks. Pt had only burning sensation in the feet but it did not last  long.   Pt noticed R knee pain 6/10  at the outside and inside knee cap with walking and moving around  this past week and dissipated over the weekend.. Pain today, pt reported 1/10.   SUBJECTIVE STATEMENT ON EVAL 05/29/24: 1)  Urinary leakage after HOLEP procedure for BPH  March 2025  :  changes Depends diapers 3 x a day . No lekaage at night . It seems worse.  Urine stream improved post surgery.  Pt notices heaviness filling up  of diaper. Pt does not feel the leakage.  Pt does feel the urge urinate but it is beyond the time the time to go, with leakaging before making it to the bathroom .   Leakage also occurs with walking .  2) numbness in B feet:  occurs with walking, walking 3 mi daily without stretching routine. Numbness eases off after 1 mile.  Somedays with  numbness and other days numbness is ok.   PERTINENT HISTORY:    PAIN:  Are you having pain? No  PRECAUTIONS: None  WEIGHT BEARING RESTRICTIONS: No  FALLS:  Has patient fallen in last 6 months? No  LIVING ENVIRONMENT: Lives with: wife  Lives in: two story  Stairs: STE 4 with rail,  steps to above garage apartment with rail    OCCUPATION: retired for the past 9 years,  active caring for grandchildren, walking 3 mi daily without stretching routine   PLOF: IND   PATIENT GOALS:   Not to wear depends   OBJECTIVE:   [facility] PT Assessment - 07/11/24 0856       Coordination   Coordination and Movement Description poor  propioception of transverse arch co-activation B      Sit to Stand   Comments pre tX: narrow BOS,  post Tx: wider BOS with training and manual Tx      AROM   Overall AROM Comments L DF 80 deg, R 70 deg      Palpation   SI assessment  levelled shoulders, pelvis    Palpation comment hypomobile tib/fib L , ext longus/ ant tib L, tightness along plantar fascia L,      Ambulation/Gait   Gait Comments supination at subtalar on RLE,  more trunk rotation,          [facility] Adult PT Treatment/Exercise - 07/11/24 0856       Neuro Re-ed    Neuro Re-ed Details  excessive propioception and tactile cues for increasing DF/ toe abduciton, co-activation of transverse arches / increase DF      Manual Therapy   Manual therapy comments distraction, PA/AP mob Grade III, STM/MWM at areas noted in assessment to promote midfoot mobility and less supination  on L             HOME EXERCISE PROGRAM: See pt instruction section    ASSESSMENT:  CLINICAL IMPRESSION:  Improvements:   Pt  reports able to sense leakage occurring instead of noticing diaper is full .  B numbness  resolved since removing stiff arch support from shoes and maintaining feet mobility HEP. No numbness with walking 3 miles a day   Gait has improved with more feet/ ankle mobility, and this week, showing more trunk mobility, armswing. R DF AROM showed good carry over from last session.  Manual Tx applied to L LKC to improve DF/ toe abduction and to minimzie supination and high arches.   Pt demo's improved sit to stand with wider BOS with increased DF.     Anticipate these gait  improvements will help pt make it to the toilet before leakage.  Plan to  continue addressing alignment, pelvis, pelvic floor to  promote optimize IAP system for improved pelvic floor function, trunk stability, gait, balance, stabilization with mobility tasks.  Regional interdependent approaches will yield greater benefits in pt's POC  Plan to add  deep core training once pt demonstrates improved diaphragm excursion improvement bilaterally.                                                          Pt benefits from skilled PT.  OBJECTIVE IMPAIRMENTS decreased activity tolerance, decreased coordination, decreased endurance, decreased mobility, difficulty walking, decreased ROM, decreased strength, decreased safety awareness, hypomobility, increased muscle spasms, impaired flexibility, improper body mechanics, postural dysfunction, and pain. scar restrictions   ACTIVITY LIMITATIONS  self-care,  home chores, work tasks    PARTICIPATION LIMITATIONS:  community, walking activities    PERSONAL FACTORS   affecting patient's functional outcome:    REHAB POTENTIAL: Good   CLINICAL DECISION MAKING: Evolving/moderate complexity   EVALUATION COMPLEXITY: Moderate    PATIENT EDUCATION:    Education details: Showed pt anatomy images. Explained muscles attachments/ connection, physiology of deep core system/ spinal- thoracic-pelvis-lower kinetic chain as they relate to pt's presentation, Sx, and past Hx. Explained what and how these areas of deficits need to be restored to balance and function    See Therapeutic activity / neuromuscular re-education section  Answered pt's questions.   Person educated: Patient Education method: Explanation, Demonstration, Tactile cues, Verbal cues, and Handouts Education comprehension: verbalized understanding, returned demonstration, verbal cues required, tactile cues required, and needs further education     PLAN: PT FREQUENCY: 1x/week   PT DURATION: 10 weeks   PLANNED INTERVENTIONS:   Gait training;Stair training;Functional mobility training;DME Instruction;Therapeutic activities;Therapeutic exercise;Balance training;Neuromuscular re-education;Patient/family education;Vestibular;Visual/perceptual remediation/compensation;Passive range of motion;Moist Heat;Cryotherapy;Traction;Canalith  Repostioning;Joint Manipulations;Manual lymph drainage;Manual techniques;Scar mobilization;Energy conservation;Dry needling;ADLs/Self Care Home Management;Biofeedback;Electrical Stimulation;Taping    PLAN FOR NEXT SESSION: See clinical impression for plan     GOALS: Goals reviewed with patient? Yes  SHORT TERM GOALS: Target date: 06/26/2024    Pt will demo IND with HEP                    Baseline: Not IND            Goal status: INITIAL   LONG TERM GOALS: Target date: 08/07/2024    1.Pt will demo proper deep core coordination without chest breathing and optimal excursion of diaphragm/pelvic floor in order to promote spinal stability and pelvic floor function  Baseline: dyscoordination Goal status: INITIAL  2.  Pt will demo proper body mechanics in against gravity tasks and ADLs  work tasks, fitness  to minimize straining pelvic floor / back    Baseline: not IND, improper form that places strain on pelvic floor  Goal status: INITIAL    3. Pt will demo increased gait speed > 1.3 m/s with reciprocal gait pattern, longer stride length  in order to ambulate safely in community and return to fitness routine  Baseline: 0.97 m/s , decreased stance on R, L adducted knees , asymmetries to spine , limited hip /knee flexion Goal status: INITIAL    4. Pt will demo levelled pelvic girdle and shoulder height in order to progress to deep core strengthening HEP and restore mobility at spine, pelvis, gait, posture minimize falls, and improve balance  Baseline: R shoulder, R iliac crest lowered  Goal status:  Partially met ( pelvis levelled, L shoulder slightly higher .  06/27/24  5th visit)     5. Pt will improve IPPS questionnaire to  pts  score change  to demo improved QOL  Baseline:  ( greater pts indicate greater negative impact on QOL)  10   pts  ( total)  moderate range     5  pts unhappy ( QOL score)   Baseline:  Goal status: INITIAL  6. Pt will be IND with stretching  routine post walking for optimal flexibility and continence. Pt will also report <50% leakage with walking .  Pt will report B feet numbness occurring less in intensity or at > 2 miles   Baseline: :leakage occurs with walking, B feet numbness occur at 1 mile point with walking routine  GOAL STATUS: partially met ( 06/27/24  Visit 5 : B numbness only occurred once at 1 mile during last week)    7. Pt will report gained sensation of leakage > 1 x day and report decreased diaper wear from 3 x a day to < 2 x diapers   Baseline: only notices when the diaper is full , once a day, pt senses leakage  Goal Status: Partially Met ( 06/27/24:  able to sense leakage occurring instead of noticing diaper is full)    8. Sensory test to be performed for numbness  Baseline: to be assessed Goal Status: Initial    Pia Lupe Plump, PT 07/11/2024, 8:59 AM

## 2024-07-11 NOTE — Patient Instructions (Signed)
 Strengthening feet arches:    Feet slides :   Points of contact at sitting bones  Four points of contact of foot,  Side knee back while keeping knee out along 2-3rd toe line   Heel up, ankle not twist out Lower heel while keeping knee out along 2-3rd toe line Four points of contact of foot, Slide foot back while keeping knee out along 2-3rd toe line   Repeated with other foot    3 min __  Feet care :  Self -feet massage   Handshake : fingers between toes, moving ballmounds/toes back and forth several times while other hand anchors at arch. Do the same at the hind/mid foot.  Heel to toes upward to a letter Big Letter T strokes to spread ballmounds and toes, several times, pinch between webs of toes  Run finger tips along top of foot between long bones comb between the bones    Wiggle toes and spread them out when relaxing

## 2024-07-18 ENCOUNTER — Encounter: Admitting: Physical Therapy

## 2024-07-25 ENCOUNTER — Ambulatory Visit: Admitting: Physical Therapy

## 2024-07-25 DIAGNOSIS — R200 Anesthesia of skin: Secondary | ICD-10-CM | POA: Insufficient documentation

## 2024-07-25 DIAGNOSIS — M533 Sacrococcygeal disorders, not elsewhere classified: Secondary | ICD-10-CM | POA: Insufficient documentation

## 2024-07-25 DIAGNOSIS — R202 Paresthesia of skin: Secondary | ICD-10-CM | POA: Insufficient documentation

## 2024-07-25 DIAGNOSIS — R2689 Other abnormalities of gait and mobility: Secondary | ICD-10-CM | POA: Insufficient documentation

## 2024-07-25 NOTE — Therapy (Signed)
 OUTPATIENT PHYSICAL THERAPY TREATMENT   Patient Name: Charles Mccullough MRN: [id number] DOB:06-Jun-1956, 68 y.o., male Today's Date: 07/25/2024   PT End of Session - 07/25/24 0922     Visit Number 8    Number of Visits 10    Date for Recertification  08/07/24    PT Start Time 0850    PT Stop Time 0930    PT Time Calculation (min) 40 min    Activity Tolerance Patient tolerated treatment well;No increased pain    Behavior During Therapy [facility] for tasks assessed/performed          Past Medical History:  Diagnosis Date   Aortic atherosclerosis    Arthritis    Bladder stone    BPH with urinary obstruction    Complication of anesthesia    a.) prolonged/delayed emergence   Coronary artery disease    Diastolic dysfunction    a.) TTE 11/10/2019: EF >55%, no RWMAs, G1DD, norm RVSF, mild MAC, triv panval regurg   Diverticulosis    History of bilateral cataract extraction    HLD (hyperlipidemia)    Hypertension    Kidney stones 2004   Long-term use of aspirin therapy    Pyelonephritis 2004   RBBB (right bundle branch block)    Staghorn calculus    T2DM (type 2 diabetes mellitus) (HCC)    Past Surgical History:  Procedure Laterality Date   COLONOSCOPY     x 3   COLONOSCOPY W/ POLYPECTOMY     CYSTOSCOPY WITH LITHOLAPAXY N/A 11/01/2023   Procedure: CYSTOSCOPY, WITH BLADDER CALCULUS LITHOLAPAXY;  Surgeon: Penne Knee, MD;  Location: [facility] ORS;  Service: Urology;  Laterality: N/A;   EXTENSOR TENDON OF FOREARM / WRIST REPAIR Right    EYE SURGERY Bilateral    cataracts with lens replace   HOLEP-LASER ENUCLEATION OF THE PROSTATE WITH MORCELLATION N/A 11/01/2023   Procedure: ENUCLEATION, PROSTATE, USING LASER, WITH MORCELLATION;  Surgeon: Penne Knee, MD;  Location: [facility] OR[facility];  Service: Urology;  Laterality: N/A;   KIDNEY STONE SURGERY     There are no active problems to display for this patient.   ERE:Dejmxd   REFERRING PROVIDER: Sparks   REFERRING DIAG: R32 (ICD-10-CM) -  Urinary incontinence, unspecified type  Rationale for Evaluation and Treatment Rehabilitation  THERAPY DIAG:  Sacrococcygeal disorders, not elsewhere classified  Other abnormalities of gait and mobility  Numbness and tingling of both feet  ONSET DATE:   SUBJECTIVE:   SUBJECTIVE STATEMENT TODAY:  Pt  reports  his dtr complimented him on how he is walking straighter. Pt reported no more numbness in the feet across the past 2 weeks. Pt had only burning sensation in the feet but it did not last  long.   Pt noticed R knee pain 6/10  at the outside and inside knee cap with walking and moving around  this past week and dissipated over the weekend.. Pain today, pt reported 1/10.   SUBJECTIVE STATEMENT ON EVAL 05/29/24: 1)  Urinary leakage after HOLEP procedure for BPH  March 2025  :  changes Depends diapers 3 x a day . No lekaage at night . It seems worse.  Urine stream improved post surgery.  Pt notices heaviness filling up  of diaper. Pt does not feel the leakage.  Pt does feel the urge urinate but it is beyond the time the time to go, with leakaging before making it to the bathroom .   Leakage also occurs with walking .  2) numbness in B feet:  occurs with walking, walking 3 mi daily without stretching routine. Numbness eases off after 1 mile.  Somedays with  numbness and other days numbness is ok.   PERTINENT HISTORY:    PAIN:  Are you having pain? No  PRECAUTIONS: None  WEIGHT BEARING RESTRICTIONS: No  FALLS:  Has patient fallen in last 6 months? No  LIVING ENVIRONMENT: Lives with: wife  Lives in: two story  Stairs: STE 4 with rail,  steps to above garage apartment with rail    OCCUPATION: retired for the past 9 years,  active caring for grandchildren, walking 3 mi daily without stretching routine   PLOF: IND   PATIENT GOALS:   Not to wear depends   OBJECTIVE:    [facility] PT Assessment - 07/25/24 1051       Palpation   SI assessment  L shoulder higher, L lumbar  convex curve: hypomobile deviated L1-2 L    Palpation comment SIJ hypomible, limited hip ext of ilia B      Ambulation/Gait   Gait Comments  1.25  m/s m/s          [facility] Adult PT Treatment/Exercise - 07/25/24 1051       Neuro Re-ed    Neuro Re-ed Details  propiceptive cues for higher knees in gait      Manual Therapy   Manual therapy comments PA mob Grade III T12-L3 Grade II-III,  MWM at SIJ B to prmote ER/abd of ilia, antterior tilt of pelvis                HOME EXERCISE PROGRAM: See pt instruction section    ASSESSMENT:  CLINICAL IMPRESSION:  Improvements:   Pt  reports able to sense leakage occurring instead of noticing diaper is full .  B numbness  resolved since removing stiff arch support from shoes and maintaining feet mobility HEP. No numbness with walking 3 miles a day   Gait has improved with more feet/ ankle mobility, and this week, showing more trunk mobility, armswing.  Gait speed improved from 0.97 m/s to  1.25  m/s   Pt demo's improved sit to stand with wider BOS with increased DF.   Anticipate these gait  improvements will help pt make it to the toilet before leakage.   Addressed L convex curve at lumbar and B SIJ to promote more anterior tilt of pelvis, hip ext, T/L junction mobility.  Pt will continue to need more manual Tx at next session to address these deficits and plan to add new LKC exercise with backward walkimg and eccentric control of posterior chain LKC     Plan to  continue addressing alignment, pelvis, pelvic floor to  promote optimize IAP system for improved pelvic floor function, trunk stability, gait, balance, stabilization with mobility tasks.  Regional interdependent approaches will yield greater benefits in pt's POC  Plan to add deep core training once pt demonstrates improved diaphragm excursion improvement bilaterally.                                                          Pt benefits from skilled PT.    OBJECTIVE  IMPAIRMENTS decreased activity tolerance, decreased coordination, decreased endurance, decreased mobility, difficulty walking, decreased ROM, decreased strength, decreased safety awareness, hypomobility, increased muscle spasms, impaired flexibility, improper body mechanics, postural  dysfunction, and pain. scar restrictions   ACTIVITY LIMITATIONS  self-care,  home chores, work tasks    PARTICIPATION LIMITATIONS:  community, walking activities    PERSONAL FACTORS   affecting patient's functional outcome:    REHAB POTENTIAL: Good   CLINICAL DECISION MAKING: Evolving/moderate complexity   EVALUATION COMPLEXITY: Moderate    PATIENT EDUCATION:    Education details: Showed pt anatomy images. Explained muscles attachments/ connection, physiology of deep core system/ spinal- thoracic-pelvis-lower kinetic chain as they relate to pt's presentation, Sx, and past Hx. Explained what and how these areas of deficits need to be restored to balance and function    See Therapeutic activity / neuromuscular re-education section  Answered pt's questions.   Person educated: Patient Education method: Explanation, Demonstration, Tactile cues, Verbal cues, and Handouts Education comprehension: verbalized understanding, returned demonstration, verbal cues required, tactile cues required, and needs further education     PLAN: PT FREQUENCY: 1x/week   PT DURATION: 10 weeks   PLANNED INTERVENTIONS:   Gait training;Stair training;Functional mobility training;DME Instruction;Therapeutic activities;Therapeutic exercise;Balance training;Neuromuscular re-education;Patient/family education;Vestibular;Visual/perceptual remediation/compensation;Passive range of motion;Moist Heat;Cryotherapy;Traction;Canalith Repostioning;Joint Manipulations;Manual lymph drainage;Manual techniques;Scar mobilization;Energy conservation;Dry needling;ADLs/Self Care Home Management;Biofeedback;Electrical Stimulation;Taping    PLAN FOR  NEXT SESSION: See clinical impression for plan     GOALS: Goals reviewed with patient? Yes  SHORT TERM GOALS: Target date: 06/26/2024    Pt will demo IND with HEP                    Baseline: Not IND            Goal status: INITIAL   LONG TERM GOALS: Target date: 08/07/2024    1.Pt will demo proper deep core coordination without chest breathing and optimal excursion of diaphragm/pelvic floor in order to promote spinal stability and pelvic floor function  Baseline: dyscoordination Goal status: INITIAL  2.  Pt will demo proper body mechanics in against gravity tasks and ADLs  work tasks, fitness  to minimize straining pelvic floor / back    Baseline: not IND, improper form that places strain on pelvic floor  Goal status: INITIAL    3. Pt will demo increased gait speed > 1.3 m/s with reciprocal gait pattern, longer stride length  in order to ambulate safely in community and return to fitness routine  Baseline: 0.97 m/s , decreased stance on R, L adducted knees , asymmetries to spine , limited hip /knee flexion Goal status: partially Met 1.25  m/s ( reciprocal gait, no more numbness of feet, no longer wearing stiff orthotics)      4. Pt will demo levelled pelvic girdle and shoulder height in order to progress to deep core strengthening HEP and restore mobility at spine, pelvis, gait, posture minimize falls, and improve balance  Baseline: R shoulder, R iliac crest lowered  Goal status:   MET ( 07/25/24)  .   5. Pt will improve IPPS questionnaire to  pts  score change  to demo improved QOL  Baseline:  ( greater pts indicate greater negative impact on QOL)  10   pts  ( total)  moderate range     5  pts unhappy ( QOL score)   Baseline:  Goal status: Ongoing   6. Pt will be IND with stretching routine post walking for optimal flexibility and continence. Pt will also report <50% leakage with walking . Pt will report B feet numbness occurring less in intensity or at > 2 miles     Baseline: :leakage occurs  with walking, B feet numbness occur at 1 mile point with walking routine  GOAL STATUS:  Partially Met  ( 06/27/24  Visit 5 : B numbness only occurred once at 1 mile during last week)  ( 07/25/24: no more numbness after 1 mile , still leakage, and )    7. Pt will report gained sensation of leakage > 1 x day and report decreased diaper wear from 3 x a day to < 2 x diapers   Baseline: only notices when the diaper is full , once a day, pt senses leakage  Goal Status: Partially Met ( 06/27/24:  able to sense leakage occurring instead of noticing diaper is full)    8. Sensory test to be performed for numbness  Baseline: to be assessed Goal Status:DEFERRED   Pia Lupe Plump, PT 07/25/2024, 9:22 AM

## 2024-08-01 ENCOUNTER — Ambulatory Visit: Admitting: Physical Therapy

## 2024-08-22 ENCOUNTER — Ambulatory Visit: Attending: Internal Medicine | Admitting: Physical Therapy

## 2024-08-22 DIAGNOSIS — R200 Anesthesia of skin: Secondary | ICD-10-CM | POA: Insufficient documentation

## 2024-08-22 DIAGNOSIS — R2689 Other abnormalities of gait and mobility: Secondary | ICD-10-CM | POA: Insufficient documentation

## 2024-08-22 DIAGNOSIS — R202 Paresthesia of skin: Secondary | ICD-10-CM | POA: Diagnosis present

## 2024-08-22 DIAGNOSIS — M533 Sacrococcygeal disorders, not elsewhere classified: Secondary | ICD-10-CM | POA: Diagnosis present

## 2024-08-22 NOTE — Therapy (Signed)
 " OUTPATIENT PHYSICAL THERAPY TREATMENT / recert   Patient Name: Charles Mccullough MRN: [id number] DOB:12-15-1955, 69 y.o., male Today's Date: 08/22/2024   PT End of Session - 08/22/24 0854     Visit Number 9    Number of Visits 18    Date for Recertification  10/31/24    PT Start Time 0848    PT Stop Time 0930    PT Time Calculation (min) 42 min    Activity Tolerance Patient tolerated treatment well;No increased pain    Behavior During Therapy [facility] for tasks assessed/performed          Past Medical History:  Diagnosis Date   Aortic atherosclerosis    Arthritis    Bladder stone    BPH with urinary obstruction    Complication of anesthesia    a.) prolonged/delayed emergence   Coronary artery disease    Diastolic dysfunction    a.) TTE 11/10/2019: EF >55%, no RWMAs, G1DD, norm RVSF, mild MAC, triv panval regurg   Diverticulosis    History of bilateral cataract extraction    HLD (hyperlipidemia)    Hypertension    Kidney stones 2004   Long-term use of aspirin therapy    Pyelonephritis 2004   RBBB (right bundle branch block)    Staghorn calculus    T2DM (type 2 diabetes mellitus) (HCC)    Past Surgical History:  Procedure Laterality Date   COLONOSCOPY     x 3   COLONOSCOPY W/ POLYPECTOMY     CYSTOSCOPY WITH LITHOLAPAXY N/A 11/01/2023   Procedure: CYSTOSCOPY, WITH BLADDER CALCULUS LITHOLAPAXY;  Surgeon: Penne Knee, MD;  Location: [facility] OR[facility];  Service: Urology;  Laterality: N/A;   EXTENSOR TENDON OF FOREARM / WRIST REPAIR Right    EYE SURGERY Bilateral    cataracts with lens replace   HOLEP-LASER ENUCLEATION OF THE PROSTATE WITH MORCELLATION N/A 11/01/2023   Procedure: ENUCLEATION, PROSTATE, USING LASER, WITH MORCELLATION;  Surgeon: Penne Knee, MD;  Location: [facility];  Service: Urology;  Laterality: N/A;   KIDNEY STONE SURGERY     There are no active problems to display for this patient.   ERE:Dejmxd   REFERRING PROVIDER: Sparks   REFERRING DIAG: R32  (ICD-10-CM) - Urinary incontinence, unspecified type  Rationale for Evaluation and Treatment Rehabilitation  THERAPY DIAG:  Sacrococcygeal disorders, not elsewhere classified  Other abnormalities of gait and mobility  ONSET DATE:   SUBJECTIVE:   SUBJECTIVE STATEMENT TODAY: Pt reports he continues to have no numbness with walking 3 miles a day. Numbness does return at 4/10 both ( instead of 7-8/10) feet across bottom of feet from ballmounds to heel when he is up doing activities on his feet all day.   Leakage is still the same , not worse. Occurs when he is up and and on his walking routine.   SUBJECTIVE STATEMENT ON EVAL 05/29/24: 1)  Urinary leakage after HOLEP procedure for BPH  March 2025  :  changes Depends diapers 3 x a day . No leakage at night . It seems worse.  Urine stream improved post surgery.  Pt notices heaviness filling up  of diaper. Pt does not feel the leakage.  Pt does feel the urge urinate but it is beyond the time the time to go, with leakaging before making it to the bathroom .   Leakage also occurs with walking .  2) numbness in B feet:  occurs with walking, walking 3 mi daily without stretching routine. Numbness eases off after 1 mile.  Somedays with  numbness and other days numbness is ok.   PERTINENT HISTORY:    PAIN:  Are you having pain? No  PRECAUTIONS: None  WEIGHT BEARING RESTRICTIONS: No  FALLS:  Has patient fallen in last 6 months? No  LIVING ENVIRONMENT: Lives with: wife  Lives in: two story  Stairs: STE 4 with rail,  steps to above garage apartment with rail    OCCUPATION: retired for the past 9 years,  active caring for grandchildren, walking 3 mi daily without stretching routine   PLOF: IND   PATIENT GOALS:   Not to wear depends   OBJECTIVE:    [facility] PT Assessment - 08/22/24 0906       Palpation   SI assessment  levelled pelvis, R shoulder lowered    Palpation comment tightness along interspinals R side at T/L junction,  paraspinals L, tenderness      Ambulation/Gait   Gait Comments 1.38 m/s ( reciprocal gait, no more numbness of feet, no longer wearing stiff orthotics)      , trunk lean on R          [facility] Adult PT Treatment/Exercise - 08/22/24 0906       Self-Care   Self-Care Other Self-Care Comments    Other Self-Care Comments  reassessed goals      Neuro Re-ed    Neuro Re-ed Details  provided excessive propioceptive cues for posterior body , pelvic tilt anterior in hooklying, and txt distal metatarsal phalanges  to minimize clawing / tightness of pelvic floor      Manual Therapy   Manual therapy comments STM/MWM R sidelying to address hypomobility and tightness at T/L junciton, modified technique to accmondate comfort             HOME EXERCISE PROGRAM: See pt instruction section    ASSESSMENT:  CLINICAL IMPRESSION:  Pt met 3/8 goals and progressing well towards remaining goals   Improvements include Pt  reports able to sense leakage occurring instead of noticing diaper is full .  B feet numbness resolved since removing stiff arch support from shoes and maintaining feet mobility HEP. No numbness with walking 3 miles a day. Numbness does return at 4/10 both ( instead of 7-8/10) feet across bottom of feet from ballmounds to heel when he is up doing activities on his feet all day.   Gait has improved with more feet/ ankle mobility, and this week, showing more trunk mobility, armswing.  Gait speed improved from 0.97 m/s to  1.25  m/s    Anticipate these gait improvements will help pt make it to the toilet before leakage and upright posture will help with IAP system for incontinence.   Manual Tx applied to further improve T/L junction misalignments. Required excessive cues for feet co-activation , minimizing clawing of toes which is associated with tight pelvic floor mm. Explained correlations between feet and pelvic floor.  Plan to progress to deep core next session as pt demonstrates  improved diaphragm excursion improvement bilaterally.    Plan to  continue addressing alignment, pelvis, pelvic floor , LKC  to  promote optimize IAP system for improved pelvic floor function, trunk stability, gait, balance, stabilization with mobility tasks.  Regional interdependent approaches will yield greater benefits in pt's POC  Pt benefits from skilled PT.    OBJECTIVE IMPAIRMENTS decreased activity tolerance, decreased coordination, decreased endurance, decreased mobility, difficulty walking, decreased ROM, decreased strength, decreased safety awareness, hypomobility, increased muscle spasms, impaired flexibility, improper body mechanics, postural dysfunction, and pain. scar restrictions   ACTIVITY LIMITATIONS  self-care,  home chores, work tasks    PARTICIPATION LIMITATIONS:  community, walking activities    PERSONAL FACTORS   affecting patient's functional outcome:    REHAB POTENTIAL: Good   CLINICAL DECISION MAKING: Evolving/moderate complexity   EVALUATION COMPLEXITY: Moderate    PATIENT EDUCATION:    Education details: Showed pt anatomy images. Explained muscles attachments/ connection, physiology of deep core system/ spinal- thoracic-pelvis-lower kinetic chain as they relate to pt's presentation, Sx, and past Hx. Explained what and how these areas of deficits need to be restored to balance and function    See Therapeutic activity / neuromuscular re-education section  Answered pt's questions.   Person educated: Patient Education method: Explanation, Demonstration, Tactile cues, Verbal cues, and Handouts Education comprehension: verbalized understanding, returned demonstration, verbal cues required, tactile cues required, and needs further education     PLAN: PT FREQUENCY: 1x/week   PT DURATION: 10 weeks   PLANNED INTERVENTIONS:   Gait training;Stair training;Functional mobility training;DME  Instruction;Therapeutic activities;Therapeutic exercise;Balance training;Neuromuscular re-education;Patient/family education;Vestibular;Visual/perceptual remediation/compensation;Passive range of motion;Moist Heat;Cryotherapy;Traction;Canalith Repostioning;Joint Manipulations;Manual lymph drainage;Manual techniques;Scar mobilization;Energy conservation;Dry needling;ADLs/Self Care Home Management;Biofeedback;Electrical Stimulation;Taping    PLAN FOR NEXT SESSION: See clinical impression for plan     GOALS: Goals reviewed with patient? Yes  SHORT TERM GOALS: Target date: 06/26/2024    Pt will demo IND with HEP                    Baseline: Not IND            Goal status: MET    LONG TERM GOALS: Target date: 10/31/2024     1.Pt will demo proper deep core coordination without chest breathing and optimal excursion of diaphragm/pelvic floor in order to promote spinal stability and pelvic floor function  Baseline: dyscoordination Goal status: ongoing   2.  Pt will demo proper body mechanics in against gravity tasks and ADLs  work tasks, fitness  to minimize straining pelvic floor / back    Baseline: not IND, improper form that places strain on pelvic floor  Goal status: MET     3. Pt will demo increased gait speed > 1.3 m/s with reciprocal gait pattern, longer stride length  in order to ambulate safely in community and return to fitness routine  Baseline: 0.97 m/s , decreased stance on R, L adducted knees , asymmetries to spine , limited hip /knee flexion Goal status: Met 1.38 m/s ( reciprocal gait, no more numbness of feet, no longer wearing stiff orthotics)      4. Pt will demo levelled pelvic girdle and shoulder height in order to progress to deep core strengthening HEP and restore mobility at spine, pelvis, gait, posture minimize falls, and improve balance  Baseline: R shoulder, R iliac crest lowered  Goal status:   MET ( 07/25/24)  .   5. Pt will improve IPPS questionnaire to   pts  score change  to demo improved QOL  Baseline:  ( greater pts indicate greater negative impact on QOL)  10   pts  ( total)  moderate range     5  pts unhappy ( QOL score)   Baseline:  Goal status: Ongoing   6. Pt will be IND  with stretching routine post walking for optimal flexibility and continence. Pt will also report <50% leakage with walking . Pt will report B feet numbness occurring less in intensity or at > 2 miles    Baseline: :leakage occurs with walking, B feet numbness occur at 1 mile point with walking routine  GOAL STATUS:  Partially Met  ( 06/27/24  Visit 5 : B numbness only occurred once at 1 mile during last week)  ( 07/25/24: no more feet numbness after 1 mile , still leakage ) ( 08/23/23:  no feet numbness across full 3 miles of his walking routine, but leakage still occurs with walking)    7. Pt will report gained sensation of leakage > 1 x day and report decreased diaper wear from 3 x a day to < 2 x diapers   Baseline: only notices when the diaper is full , once a day, pt senses leakage  Goal Status: Partially Met ( 06/27/24:  able to sense leakage occurring instead of noticing diaper is full)    8. Sensory test to be performed for numbness  Baseline: to be assessed Goal Status:  need to assess   Pia Lupe Plump, PT 08/22/2024, 8:56 AM  "

## 2024-08-22 NOTE — Patient Instructions (Signed)
 Points of contact through the back body  Practice pelvic tilts  Rest in anterior tilt of plvis  sink into the 8 points of the feet  30 reps toes spread and lifts, lower them without claw  \ __    Walk in new shoes all the time  Ditch these old pairs  Stand and notice when you claw and correct

## 2024-08-29 ENCOUNTER — Ambulatory Visit: Admitting: Physical Therapy

## 2024-08-29 DIAGNOSIS — M533 Sacrococcygeal disorders, not elsewhere classified: Secondary | ICD-10-CM | POA: Diagnosis not present

## 2024-08-29 DIAGNOSIS — R200 Anesthesia of skin: Secondary | ICD-10-CM

## 2024-08-29 DIAGNOSIS — R2689 Other abnormalities of gait and mobility: Secondary | ICD-10-CM

## 2024-08-29 NOTE — Therapy (Signed)
 " OUTPATIENT PHYSICAL THERAPY TREATMENT / Progress Note across 10 visit from 05/29/24 to current '  Patient Name: Charles ROSSBACH MRN: [id number] DOB:06-23-1956, 69 y.o., male Today's Date: 08/29/2024   PT End of Session - 08/29/24 0850     Visit Number 10    Number of Visits 18    Date for Recertification  10/31/24    Activity Tolerance Patient tolerated treatment well;No increased pain    Behavior During Therapy [facility] for tasks assessed/performed          Past Medical History:  Diagnosis Date   Aortic atherosclerosis    Arthritis    Bladder stone    BPH with urinary obstruction    Complication of anesthesia    a.) prolonged/delayed emergence   Coronary artery disease    Diastolic dysfunction    a.) TTE 11/10/2019: EF >55%, no RWMAs, G1DD, norm RVSF, mild MAC, triv panval regurg   Diverticulosis    History of bilateral cataract extraction    HLD (hyperlipidemia)    Hypertension    Kidney stones 2004   Long-term use of aspirin therapy    Pyelonephritis 2004   RBBB (right bundle branch block)    Staghorn calculus    T2DM (type 2 diabetes mellitus) (HCC)    Past Surgical History:  Procedure Laterality Date   COLONOSCOPY     x 3   COLONOSCOPY W/ POLYPECTOMY     CYSTOSCOPY WITH LITHOLAPAXY N/A 11/01/2023   Procedure: CYSTOSCOPY, WITH BLADDER CALCULUS LITHOLAPAXY;  Surgeon: Penne Knee, MD;  Location: [facility];  Service: Urology;  Laterality: N/A;   EXTENSOR TENDON OF FOREARM / WRIST REPAIR Right    EYE SURGERY Bilateral    cataracts with lens replace   HOLEP-LASER ENUCLEATION OF THE PROSTATE WITH MORCELLATION N/A 11/01/2023   Procedure: ENUCLEATION, PROSTATE, USING LASER, WITH MORCELLATION;  Surgeon: Penne Knee, MD;  Location: [facility];  Service: Urology;  Laterality: N/A;   KIDNEY STONE SURGERY     There are no active problems to display for this patient.   ERE:Dejmxd   REFERRING PROVIDER: Sparks   REFERRING DIAG: R32 (ICD-10-CM) - Urinary incontinence,  unspecified type  Rationale for Evaluation and Treatment Rehabilitation  THERAPY DIAG:  Sacrococcygeal disorders, not elsewhere classified  Other abnormalities of gait and mobility  Numbness and tingling of both feet  ONSET DATE:   SUBJECTIVE:   SUBJECTIVE STATEMENT TODAY: Pt reports he saw surgeon who recommended him to continue doing pelvic PT. Pt is not interested in the option for a penile pump.  Pt feels a different sensation in his feet like his feet feel loose in the shoe. Numbness is at 3/10 both feet instead 4/10 from lst week  Pt is paying attention to not claw his feet    SUBJECTIVE STATEMENT ON EVAL 05/29/24: 1)  Urinary leakage after HOLEP procedure for BPH  March 2025  :  changes Depends diapers 3 x a day . No leakage at night . It seems worse.  Urine stream improved post surgery.  Pt notices heaviness filling up  of diaper. Pt does not feel the leakage.  Pt does feel the urge urinate but it is beyond the time the time to go, with leakaging before making it to the bathroom .   Leakage also occurs with walking .  2) numbness in B feet:  occurs with walking, walking 3 mi daily without stretching routine. Numbness eases off after 1 mile.  Somedays with  numbness and other days numbness is  ok.   PERTINENT HISTORY:    PAIN:  Are you having pain? No  PRECAUTIONS: None  WEIGHT BEARING RESTRICTIONS: No  FALLS:  Has patient fallen in last 6 months? No  LIVING ENVIRONMENT: Lives with: wife  Lives in: two story  Stairs: STE 4 with rail,  steps to above garage apartment with rail    OCCUPATION: retired for the past 9 years,  active caring for grandchildren, walking 3 mi daily without stretching routine   PLOF: IND   PATIENT GOALS:   Not to wear depends   OBJECTIVE:     [facility] PT Assessment - 08/29/24 1009       Other:   Other/ Comments logrolling with difficulty , excessive cues of cervical flexion   Sit to stand with narrow BOS     Palpation   SI  assessment  levelled pelvic and spine, C/T junction still hypomobile , anterior neck tightness, scapular mm atachments tight R          [facility] Adult PT Treatment/Exercise - 08/29/24 1010       Neuro Re-ed    Neuro Re-ed Details  provided motorplanning cues for logrolling and scooting in bed to minimize straining pelvic floor , provided propioceptive cues for pelvic tilt anterior for optimal pelvic floor funciton in deep core HEP   Excessive cues for sit to stand wider BOS      Manual Therapy   Manual therapy comments occiput distraction, STM/MWM at R UE quadrant to minimzie forward head posture and optimize deep core HEP           HOME EXERCISE PROGRAM: See pt instruction section    ASSESSMENT:  CLINICAL IMPRESSION:  Pt met 3/8 goals and progressing well towards remaining goals   Improvements include Pt  reports able to sense leakage occurring instead of noticing diaper is full .  B feet numbness resolved since removing stiff arch support from shoes and maintaining feet mobility HEP. No numbness with walking 3 miles a day. Numbness does return at 3/10 both ( instead of 7-8/10) feet across bottom of feet from ballmounds to heel when he is up doing activities on his feet all day.   Gait has improved with more feet/ ankle mobility, and this week, showing more trunk mobility, armswing.  Gait speed improved from 0.97 m/s to  1.25  m/s    Provided manual Tx to C/ Tjunction to further minimize forward head posture. Provided motor planning cues for logrolling and scooting in bed to minimize straining pelvic floor , provided propioceptive cues for pelvic tilt anterior for optimal pelvic floor funciton in deep core HEP, provided Excessive cues for sit to stand wider BOS.  Plan to progress to deep core level 2 next session.     Plan to continue addressing neuromuscular reeducation for alignment, pelvis, pelvic floor , LKC  to  promote optimize IAP system for improved pelvic floor  function, trunk stability, gait, balance, stabilization with mobility tasks.  Regional interdependent approaches will yield greater benefits in pt's POC                                                            Pt benefits from skilled PT.    OBJECTIVE IMPAIRMENTS decreased activity tolerance, decreased coordination, decreased endurance, decreased mobility, difficulty walking,  decreased ROM, decreased strength, decreased safety awareness, hypomobility, increased muscle spasms, impaired flexibility, improper body mechanics, postural dysfunction, and pain. scar restrictions   ACTIVITY LIMITATIONS  self-care,  home chores, work tasks    PARTICIPATION LIMITATIONS:  community, walking activities    PERSONAL FACTORS   affecting patient's functional outcome:    REHAB POTENTIAL: Good   CLINICAL DECISION MAKING: Evolving/moderate complexity   EVALUATION COMPLEXITY: Moderate    PATIENT EDUCATION:    Education details: Showed pt anatomy images. Explained muscles attachments/ connection, physiology of deep core system/ spinal- thoracic-pelvis-lower kinetic chain as they relate to pt's presentation, Sx, and past Hx. Explained what and how these areas of deficits need to be restored to balance and function    See Therapeutic activity / neuromuscular re-education section  Answered pt's questions.   Person educated: Patient Education method: Explanation, Demonstration, Tactile cues, Verbal cues, and Handouts Education comprehension: verbalized understanding, returned demonstration, verbal cues required, tactile cues required, and needs further education     PLAN: PT FREQUENCY: 1x/week   PT DURATION: 10 weeks   PLANNED INTERVENTIONS:   Gait training;Stair training;Functional mobility training;DME Instruction;Therapeutic activities;Therapeutic exercise;Balance training;Neuromuscular re-education;Patient/family education;Vestibular;Visual/perceptual remediation/compensation;Passive range  of motion;Moist Heat;Cryotherapy;Traction;Canalith Repostioning;Joint Manipulations;Manual lymph drainage;Manual techniques;Scar mobilization;Energy conservation;Dry needling;ADLs/Self Care Home Management;Biofeedback;Electrical Stimulation;Taping    PLAN FOR NEXT SESSION: See clinical impression for plan     GOALS: Goals reviewed with patient? Yes  SHORT TERM GOALS: Target date: 06/26/2024    Pt will demo IND with HEP                    Baseline: Not IND            Goal status: MET    LONG TERM GOALS: Target date: 10/31/2024     1.Pt will demo proper deep core coordination without chest breathing and optimal excursion of diaphragm/pelvic floor in order to promote spinal stability and pelvic floor function  Baseline: dyscoordination Goal status: ongoing   2.  Pt will demo proper body mechanics in against gravity tasks and ADLs  work tasks, fitness  to minimize straining pelvic floor / back    Baseline: not IND, improper form that places strain on pelvic floor  Goal status: MET     3. Pt will demo increased gait speed > 1.3 m/s with reciprocal gait pattern, longer stride length  in order to ambulate safely in community and return to fitness routine  Baseline: 0.97 m/s , decreased stance on R, L adducted knees , asymmetries to spine , limited hip /knee flexion Goal status: Met 1.38 m/s ( reciprocal gait, no more numbness of feet, no longer wearing stiff orthotics)      4. Pt will demo levelled pelvic girdle and shoulder height in order to progress to deep core strengthening HEP and restore mobility at spine, pelvis, gait, posture minimize falls, and improve balance  Baseline: R shoulder, R iliac crest lowered  Goal status:   MET ( 07/25/24)  .   5. Pt will improve IPPS questionnaire to  pts  score change  to demo improved QOL  Baseline:  ( greater pts indicate greater negative impact on QOL)  10   pts  ( total)  moderate range     5  pts unhappy ( QOL score)   Baseline:   Goal status: Ongoing   6. Pt will be IND with stretching routine post walking for optimal flexibility and continence. Pt will also report <50% leakage with walking . Pt will  report B feet numbness occurring less in intensity or at > 2 miles    Baseline: :leakage occurs with walking, B feet numbness occur at 1 mile point with walking routine  GOAL STATUS:  Partially Met  ( 06/27/24  Visit 5 : B numbness only occurred once at 1 mile during last week)  ( 07/25/24: no more feet numbness after 1 mile , still leakage ) ( 08/23/23:  no feet numbness across full 3 miles of his walking routine, but leakage still occurs with walking)    7. Pt will report gained sensation of leakage > 1 x day and report decreased diaper wear from 3 x a day to < 2 x diapers   Baseline: only notices when the diaper is full , once a day, pt senses leakage  Goal Status: Partially Met ( 06/27/24:  able to sense leakage occurring instead of noticing diaper is full)    8. Sensory test to be performed for numbness  Baseline: to be assessed Goal Status:  need to assess   Pia Lupe Plump, PT 08/29/2024, 8:50 AM   "

## 2024-09-06 ENCOUNTER — Ambulatory Visit: Admitting: Physical Therapy

## 2024-09-06 DIAGNOSIS — R2689 Other abnormalities of gait and mobility: Secondary | ICD-10-CM

## 2024-09-06 DIAGNOSIS — M533 Sacrococcygeal disorders, not elsewhere classified: Secondary | ICD-10-CM

## 2024-09-06 DIAGNOSIS — R200 Anesthesia of skin: Secondary | ICD-10-CM

## 2024-09-06 NOTE — Patient Instructions (Signed)
" °  Proper body mechanics with getting out of a chair to decrease strain  on back &pelvic floor   Avoid holding your breath when Getting out of the chair:  Scoot to front part of chair chair Wider feet Heels behind knees, feet are hip width apart, nose over toes   Inhale like you are smelling roses Exhale to stand  With hands pushing off   _____    LEAVY WITH RESISTANCE BLUE Band at waist connected to doorknob Stepping forward normal length steps, planting mid and forefoot down, center of mass ( navel) leans forward slightly as if you were walking uphill 3-4 steps till band feels taut ( MAKE SURE THE DOOR IS LOCKED AND WON'T OPEN)   Stepping backwards, lower heel slowly, carry trunk and hips back , leaning forward, front knee along 2-3 rd toe line    3 min  ____ "

## 2024-09-06 NOTE — Therapy (Signed)
 " OUTPATIENT PHYSICAL THERAPY TREATMENT  '  Patient Name: Charles Mccullough MRN: [id number] DOB:[birth year], 69 y.o., male Today's Date: 09/06/2024   PT End of Session - 09/06/24 1011     Visit Number 11    Number of Visits 18    Date for Recertification  10/31/24    PT Start Time 0945    PT Stop Time 1025    PT Time Calculation (min) 40 min    Activity Tolerance Patient tolerated treatment well;No increased pain    Behavior During Therapy [facility] for tasks assessed/performed          Past Medical History:  Diagnosis Date   Aortic atherosclerosis    Arthritis    Bladder stone    BPH with urinary obstruction    Complication of anesthesia    a.) prolonged/delayed emergence   Coronary artery disease    Diastolic dysfunction    a.) TTE 11/10/2019: EF >55%, no RWMAs, G1DD, norm RVSF, mild MAC, triv panval regurg   Diverticulosis    History of bilateral cataract extraction    HLD (hyperlipidemia)    Hypertension    Kidney stones 2004   Long-term use of aspirin therapy    Pyelonephritis 2004   RBBB (right bundle branch block)    Staghorn calculus    T2DM (type 2 diabetes mellitus) (HCC)    Past Surgical History:  Procedure Laterality Date   COLONOSCOPY     x 3   COLONOSCOPY W/ POLYPECTOMY     CYSTOSCOPY WITH LITHOLAPAXY N/A 11/01/2023   Procedure: CYSTOSCOPY, WITH BLADDER CALCULUS LITHOLAPAXY;  Surgeon: Penne Knee, MD;  Location: [facility];  Service: Urology;  Laterality: N/A;   EXTENSOR TENDON OF FOREARM / WRIST REPAIR Right    EYE SURGERY Bilateral    cataracts with lens replace   HOLEP-LASER ENUCLEATION OF THE PROSTATE WITH MORCELLATION N/A 11/01/2023   Procedure: ENUCLEATION, PROSTATE, USING LASER, WITH MORCELLATION;  Surgeon: Penne Knee, MD;  Location: [facility];  Service: Urology;  Laterality: N/A;   KIDNEY STONE SURGERY     There are no active problems to display for this patient.   ERE:Dejmxd   REFERRING PROVIDER: Sparks   REFERRING DIAG: R32 (ICD-10-CM) -  Urinary incontinence, unspecified type  Rationale for Evaluation and Treatment Rehabilitation  THERAPY DIAG:  Sacrococcygeal disorders, not elsewhere classified  Other abnormalities of gait and mobility  Numbness and tingling of both feet  ONSET DATE:   SUBJECTIVE:   SUBJECTIVE STATEMENT TODAY: Pt reports he has been doing sit to stand without using hands for 25 reps    SUBJECTIVE STATEMENT ON EVAL 05/29/24: 1)  Urinary leakage after HOLEP procedure for BPH  March 2025  :  changes Depends diapers 3 x a day . No leakage at night . It seems worse.  Urine stream improved post surgery.  Pt notices heaviness filling up  of diaper. Pt does not feel the leakage.  Pt does feel the urge urinate but it is beyond the time the time to go, with leakaging before making it to the bathroom .   Leakage also occurs with walking .  2) numbness in B feet:  occurs with walking, walking 3 mi daily without stretching routine. Numbness eases off after 1 mile.  Somedays with  numbness and other days numbness is ok.   PERTINENT HISTORY:    PAIN:  Are you having pain? No  PRECAUTIONS: None  WEIGHT BEARING RESTRICTIONS: No  FALLS:  Has patient fallen in last 6 months?  No  LIVING ENVIRONMENT: Lives with: wife  Lives in: two story  Stairs: STE 4 with rail,  steps to above garage apartment with rail    OCCUPATION: retired for the past 9 years,  active caring for grandchildren, walking 3 mi daily without stretching routine   PLOF: IND   PATIENT GOALS:   Not to wear depends   OBJECTIVE:   [facility] PT Assessment - 09/06/24 1032       Sit to Stand   Comments without UE, narrow BOS, heels together, valsava      Other:   Other/ Comments backward stepping with resistance band at door knob : poor propioception with knee alignment,poor  hinge hip      Palpation   SI assessment  levelled pelvic and  spine, C/T junction and thoracic spine more stacked , no more rounded shoulders            [facility] Adult PT Treatment/Exercise - 09/06/24 1044       Self-Care   Self-Care Other Self-Care Comments    Other Self-Care Comments  expained to use hands for sit to stand for next 2 monthuntil pt has enough glut strength to go without UE      Therapeutic Activites    Therapeutic Activities Other Therapeutic Activities    Other Therapeutic Activities excessive cues for wider BOS, use of hands to push off x 10 reps      Neuro Re-ed    Neuro Re-ed Details  provided motorplanning cues for logrolling and scooting in bed to minimize straining pelvic floor , provided propioceptive cues for [facility] alignment and sequence for balance, glut strengthening, feet ROM, hip hinge            HOME EXERCISE PROGRAM: See pt instruction section    ASSESSMENT:  CLINICAL IMPRESSION:  Pt met 3/8 goals and progressing well towards remaining goals   Improvements include Pt  reports able to sense leakage occurring instead of noticing diaper is full .  B feet numbness resolved since removing stiff arch support from shoes and maintaining feet mobility HEP. No numbness with walking 3 miles a day. Numbness does return at 3/10 both ( instead of 7-8/10) feet across bottom of feet from ballmounds to heel when he is up doing activities on his feet all day.   Gait has improved with more feet/ ankle mobility, and this week, showing more trunk mobility, armswing.  Gait speed improved from 0.97 m/s to  1.25  m/s   Today, provided motorplanning cues for logrolling and scooting in bed to minimize straining pelvic floor , provided propioceptive cues for [facility] alignment and sequence for balance, glut strengthening, feet ROM, knee alignment, and hip hinge   Plan to progress to deep core level 2 next session.     Plan to continue addressing neuromuscular reeducation for alignment, pelvis, pelvic floor , LKC  to  promote optimize IAP system for improved pelvic floor function, trunk stability, gait, balance, stabilization  with mobility tasks.  Regional interdependent approaches will yield greater benefits in pt's POC                                                            Pt benefits from skilled PT.    OBJECTIVE IMPAIRMENTS decreased activity tolerance, decreased coordination, decreased endurance,  decreased mobility, difficulty walking, decreased ROM, decreased strength, decreased safety awareness, hypomobility, increased muscle spasms, impaired flexibility, improper body mechanics, postural dysfunction, and pain. scar restrictions   ACTIVITY LIMITATIONS  self-care,  home chores, work tasks    PARTICIPATION LIMITATIONS:  community, walking activities    PERSONAL FACTORS   affecting patient's functional outcome:    REHAB POTENTIAL: Good   CLINICAL DECISION MAKING: Evolving/moderate complexity   EVALUATION COMPLEXITY: Moderate    PATIENT EDUCATION:    Education details: Showed pt anatomy images. Explained muscles attachments/ connection, physiology of deep core system/ spinal- thoracic-pelvis-lower kinetic chain as they relate to pt's presentation, Sx, and past Hx. Explained what and how these areas of deficits need to be restored to balance and function    See Therapeutic activity / neuromuscular re-education section  Answered pt's questions.   Person educated: Patient Education method: Explanation, Demonstration, Tactile cues, Verbal cues, and Handouts Education comprehension: verbalized understanding, returned demonstration, verbal cues required, tactile cues required, and needs further education     PLAN: PT FREQUENCY: 1x/week   PT DURATION: 10 weeks   PLANNED INTERVENTIONS:   Gait training;Stair training;Functional mobility training;DME Instruction;Therapeutic activities;Therapeutic exercise;Balance training;Neuromuscular re-education;Patient/family education;Vestibular;Visual/perceptual remediation/compensation;Passive range of motion;Moist Heat;Cryotherapy;Traction;Canalith  Repostioning;Joint Manipulations;Manual lymph drainage;Manual techniques;Scar mobilization;Energy conservation;Dry needling;ADLs/Self Care Home Management;Biofeedback;Electrical Stimulation;Taping    PLAN FOR NEXT SESSION: See clinical impression for plan     GOALS: Goals reviewed with patient? Yes  SHORT TERM GOALS: Target date: 06/26/2024    Pt will demo IND with HEP                    Baseline: Not IND            Goal status: MET    LONG TERM GOALS: Target date: 10/31/2024     1.Pt will demo proper deep core coordination without chest breathing and optimal excursion of diaphragm/pelvic floor in order to promote spinal stability and pelvic floor function  Baseline: dyscoordination Goal status: ongoing   2.  Pt will demo proper body mechanics in against gravity tasks and ADLs  work tasks, fitness  to minimize straining pelvic floor / back    Baseline: not IND, improper form that places strain on pelvic floor  Goal status: MET     3. Pt will demo increased gait speed > 1.3 m/s with reciprocal gait pattern, longer stride length  in order to ambulate safely in community and return to fitness routine  Baseline: 0.97 m/s , decreased stance on R, L adducted knees , asymmetries to spine , limited hip /knee flexion Goal status: Met 1.38 m/s ( reciprocal gait, no more numbness of feet, no longer wearing stiff orthotics)      4. Pt will demo levelled pelvic girdle and shoulder height in order to progress to deep core strengthening HEP and restore mobility at spine, pelvis, gait, posture minimize falls, and improve balance  Baseline: R shoulder, R iliac crest lowered  Goal status:   MET ( 07/25/24)  .   5. Pt will improve IPPS questionnaire to  pts  score change  to demo improved QOL  Baseline:  ( greater pts indicate greater negative impact on QOL)  10   pts  ( total)  moderate range     5  pts unhappy ( QOL score)   Baseline:  Goal status: Ongoing   6. Pt will be IND with  stretching routine post walking for optimal flexibility and continence. Pt will also report <50% leakage with  walking . Pt will report B feet numbness occurring less in intensity or at > 2 miles    Baseline: :leakage occurs with walking, B feet numbness occur at 1 mile point with walking routine  GOAL STATUS:  Partially Met  ( 06/27/24  Visit 5 : B numbness only occurred once at 1 mile during last week)  ( 07/25/24: no more feet numbness after 1 mile , still leakage ) ( 08/23/23:  no feet numbness across full 3 miles of his walking routine, but leakage still occurs with walking)    7. Pt will report gained sensation of leakage > 1 x day and report decreased diaper wear from 3 x a day to < 2 x diapers   Baseline: only notices when the diaper is full , once a day, pt senses leakage  Goal Status: Partially Met ( 06/27/24:  able to sense leakage occurring instead of noticing diaper is full)    8. Sensory test to be performed for numbness  Baseline: to be assessed Goal Status:  need to assess   Pia Lupe Plump, PT 09/06/2024, 10:44 AM   "

## 2024-09-19 ENCOUNTER — Ambulatory Visit: Admitting: Physical Therapy

## 2024-09-19 ENCOUNTER — Encounter: Payer: Self-pay | Admitting: Physical Therapy

## 2024-09-19 DIAGNOSIS — M533 Sacrococcygeal disorders, not elsewhere classified: Secondary | ICD-10-CM

## 2024-09-19 DIAGNOSIS — R200 Anesthesia of skin: Secondary | ICD-10-CM

## 2024-09-19 DIAGNOSIS — R2689 Other abnormalities of gait and mobility: Secondary | ICD-10-CM

## 2024-09-19 NOTE — Patient Instructions (Addendum)
 Deep core level 1-2 ( handout)   Be aware with not gripping toes with deep core level 1-2

## 2024-09-28 ENCOUNTER — Ambulatory Visit: Admitting: Physical Therapy

## 2024-10-04 ENCOUNTER — Ambulatory Visit: Admitting: Physical Therapy

## 2024-10-05 ENCOUNTER — Ambulatory Visit: Admitting: Physical Therapy

## 2024-10-12 ENCOUNTER — Ambulatory Visit: Admitting: Physical Therapy

## 2024-10-19 ENCOUNTER — Ambulatory Visit: Admitting: Physical Therapy

## 2024-10-26 ENCOUNTER — Ambulatory Visit: Admitting: Physical Therapy

## 2024-12-13 ENCOUNTER — Ambulatory Visit: Admitting: Urology
